# Patient Record
Sex: Male | Born: 2000 | Race: Black or African American | Hispanic: No | Marital: Single | Smoking: Current some day smoker
Health system: Southern US, Community
[De-identification: ages and names within clinical notes are randomized; demographics above are authoritative.]

## PROBLEM LIST (undated history)

## (undated) DIAGNOSIS — F329 Major depressive disorder, single episode, unspecified: Secondary | ICD-10-CM

## (undated) DIAGNOSIS — F32A Depression, unspecified: Secondary | ICD-10-CM

## (undated) DIAGNOSIS — J45909 Unspecified asthma, uncomplicated: Secondary | ICD-10-CM

## (undated) DIAGNOSIS — F319 Bipolar disorder, unspecified: Secondary | ICD-10-CM

---
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
## 2018-03-10 ENCOUNTER — Other Ambulatory Visit: Payer: Self-pay

## 2018-03-10 ENCOUNTER — Observation Stay
Admission: EM | Admit: 2018-03-10 | Discharge: 2018-03-12 | Disposition: A | Discharge status: Home / Self Care | Payer: Medicaid Other | Attending: Internal Medicine | Admitting: Internal Medicine

## 2018-03-10 ENCOUNTER — Encounter: Payer: Self-pay | Admitting: Emergency Medicine

## 2018-03-10 ENCOUNTER — Emergency Department: Payer: Medicaid Other

## 2018-03-10 DIAGNOSIS — J181 Lobar pneumonia, unspecified organism: Secondary | ICD-10-CM | POA: Diagnosis present

## 2018-03-10 DIAGNOSIS — Z825 Family history of asthma and other chronic lower respiratory diseases: Secondary | ICD-10-CM | POA: Diagnosis not present

## 2018-03-10 DIAGNOSIS — F17200 Nicotine dependence, unspecified, uncomplicated: Secondary | ICD-10-CM | POA: Insufficient documentation

## 2018-03-10 DIAGNOSIS — J101 Influenza due to other identified influenza virus with other respiratory manifestations: Principal | ICD-10-CM | POA: Insufficient documentation

## 2018-03-10 DIAGNOSIS — Z716 Tobacco abuse counseling: Secondary | ICD-10-CM | POA: Insufficient documentation

## 2018-03-10 DIAGNOSIS — J4520 Mild intermittent asthma, uncomplicated: Secondary | ICD-10-CM | POA: Insufficient documentation

## 2018-03-10 DIAGNOSIS — J189 Pneumonia, unspecified organism: Secondary | ICD-10-CM | POA: Diagnosis not present

## 2018-03-10 DIAGNOSIS — E876 Hypokalemia: Secondary | ICD-10-CM | POA: Diagnosis not present

## 2018-03-10 DIAGNOSIS — Z79899 Other long term (current) drug therapy: Secondary | ICD-10-CM | POA: Diagnosis not present

## 2018-03-10 HISTORY — DX: Unspecified asthma, uncomplicated: J45.909

## 2018-03-10 LAB — CBC WITH DIFFERENTIAL/PLATELET
Abs Immature Granulocytes: 0.07 10*3/uL (ref 0.00–0.07)
BASOS ABS: 0 10*3/uL (ref 0.0–0.1)
BASOS PCT: 0 %
Eosinophils Absolute: 0 10*3/uL (ref 0.0–0.5)
Eosinophils Relative: 0 %
HCT: 41.8 % (ref 39.0–52.0)
Hemoglobin: 14 g/dL (ref 13.0–17.0)
Immature Granulocytes: 1 %
Lymphocytes Relative: 6 %
Lymphs Abs: 0.7 10*3/uL (ref 0.7–4.0)
MCH: 27.9 pg (ref 26.0–34.0)
MCHC: 33.5 g/dL (ref 30.0–36.0)
MCV: 83.4 fL (ref 80.0–100.0)
Monocytes Absolute: 0.2 10*3/uL (ref 0.1–1.0)
Monocytes Relative: 2 %
NEUTROS ABS: 10.2 10*3/uL — AB (ref 1.7–7.7)
NEUTROS PCT: 91 %
NRBC: 0 % (ref 0.0–0.2)
PLATELETS: 131 10*3/uL — AB (ref 150–400)
RBC: 5.01 MIL/uL (ref 4.22–5.81)
RDW: 12.6 % (ref 11.5–15.5)
Smear Review: NORMAL
WBC MORPHOLOGY: INCREASED
WBC: 11.2 10*3/uL — ABNORMAL HIGH (ref 4.0–10.5)

## 2018-03-10 LAB — INFLUENZA PANEL BY PCR (TYPE A & B)
Influenza A By PCR: NEGATIVE
Influenza B By PCR: POSITIVE — AB

## 2018-03-10 LAB — COMPREHENSIVE METABOLIC PANEL
ALT: 14 U/L (ref 0–44)
ANION GAP: 7 (ref 5–15)
AST: 25 U/L (ref 15–41)
Albumin: 3.8 g/dL (ref 3.5–5.0)
Alkaline Phosphatase: 56 U/L (ref 38–126)
BUN: 17 mg/dL (ref 6–20)
CHLORIDE: 107 mmol/L (ref 98–111)
CO2: 25 mmol/L (ref 22–32)
Calcium: 8.2 mg/dL — ABNORMAL LOW (ref 8.9–10.3)
Creatinine, Ser: 1.16 mg/dL (ref 0.61–1.24)
GFR calc non Af Amer: >60 mL/min (ref >60)
Glucose, Bld: 133 mg/dL — ABNORMAL HIGH (ref 70–99)
POTASSIUM: 3.3 mmol/L — AB (ref 3.5–5.1)
SODIUM: 139 mmol/L (ref 135–145)
Total Bilirubin: 0.6 mg/dL (ref 0.3–1.2)
Total Protein: 7.1 g/dL (ref 6.5–8.1)

## 2018-03-10 LAB — STREP PNEUMONIAE URINARY ANTIGEN: Strep Pneumo Urinary Antigen: NEGATIVE

## 2018-03-10 LAB — MAGNESIUM: Magnesium: 1.8 mg/dL (ref 1.7–2.4)

## 2018-03-10 LAB — LACTIC ACID, PLASMA: LACTIC ACID, VENOUS: 1.6 mmol/L (ref 0.5–1.9)

## 2018-03-10 MED ORDER — SODIUM CHLORIDE 0.9 % IV SOLN
500.0000 mg | INTRAVENOUS | Status: DC
  Administered 2018-03-11: 500 mg via INTRAVENOUS
  Filled 2018-03-10 (×2): qty 500

## 2018-03-10 MED ORDER — SODIUM CHLORIDE 0.9 % IV SOLN
500.0000 mg | Freq: Once | INTRAVENOUS | Status: AC
  Administered 2018-03-10: 500 mg via INTRAVENOUS
  Filled 2018-03-10: qty 500

## 2018-03-10 MED ORDER — KETOROLAC TROMETHAMINE 30 MG/ML IJ SOLN
30.0000 mg | Freq: Once | INTRAMUSCULAR | Status: AC
  Administered 2018-03-10: 30 mg via INTRAVENOUS
  Filled 2018-03-10: qty 1

## 2018-03-10 MED ORDER — POTASSIUM CHLORIDE CRYS ER 20 MEQ PO TBCR
40.0000 meq | EXTENDED_RELEASE_TABLET | Freq: Once | ORAL | Status: AC
  Administered 2018-03-10: 40 meq via ORAL
  Filled 2018-03-10: qty 2

## 2018-03-10 MED ORDER — OSELTAMIVIR PHOSPHATE 75 MG PO CAPS
75.0000 mg | ORAL_CAPSULE | Freq: Two times a day (BID) | ORAL | Status: DC
  Administered 2018-03-10 – 2018-03-12 (×5): 75 mg via ORAL
  Filled 2018-03-10 (×5): qty 1

## 2018-03-10 MED ORDER — ONDANSETRON HCL 4 MG/2ML IJ SOLN
INTRAMUSCULAR | Status: AC
  Administered 2018-03-10: 4 mg via INTRAVENOUS
  Filled 2018-03-10: qty 2

## 2018-03-10 MED ORDER — ACETAMINOPHEN 325 MG PO TABS
650.0000 mg | ORAL_TABLET | Freq: Four times a day (QID) | ORAL | Status: DC | PRN
  Administered 2018-03-10: 650 mg via ORAL
  Filled 2018-03-10: qty 2

## 2018-03-10 MED ORDER — GUAIFENESIN 100 MG/5ML PO SOLN
5.0000 mL | ORAL | Status: DC | PRN
  Administered 2018-03-10 – 2018-03-11 (×3): 100 mg via ORAL
  Filled 2018-03-10 (×7): qty 5

## 2018-03-10 MED ORDER — SODIUM CHLORIDE 0.9 % IV SOLN
1.0000 g | INTRAVENOUS | Status: DC
  Administered 2018-03-11: 1 g via INTRAVENOUS
  Filled 2018-03-10: qty 1
  Filled 2018-03-10: qty 10

## 2018-03-10 MED ORDER — ALBUTEROL SULFATE (2.5 MG/3ML) 0.083% IN NEBU: 2.5000 mg | INHALATION_SOLUTION | RESPIRATORY_TRACT | Status: DC | PRN

## 2018-03-10 MED ORDER — IPRATROPIUM-ALBUTEROL 0.5-2.5 (3) MG/3ML IN SOLN
3.0000 mL | Freq: Once | RESPIRATORY_TRACT | Status: AC
  Administered 2018-03-10: 3 mL via RESPIRATORY_TRACT
  Filled 2018-03-10: qty 3

## 2018-03-10 MED ORDER — BISACODYL 5 MG PO TBEC: 5.0000 mg | DELAYED_RELEASE_TABLET | Freq: Every day | ORAL | Status: DC | PRN

## 2018-03-10 MED ORDER — HYDROCODONE-ACETAMINOPHEN 5-325 MG PO TABS
1.0000 | ORAL_TABLET | ORAL | Status: DC | PRN
  Administered 2018-03-10 – 2018-03-12 (×5): 2 via ORAL
  Filled 2018-03-10 (×5): qty 2

## 2018-03-10 MED ORDER — SODIUM CHLORIDE 0.9 % IV SOLN
1.0000 g | Freq: Once | INTRAVENOUS | Status: AC
  Administered 2018-03-10: 1 g via INTRAVENOUS
  Filled 2018-03-10: qty 10

## 2018-03-10 MED ORDER — SODIUM CHLORIDE 0.9 % IV SOLN
1000.0000 mL | Freq: Once | INTRAVENOUS | Status: AC
  Administered 2018-03-10: 1000 mL via INTRAVENOUS

## 2018-03-10 MED ORDER — ONDANSETRON HCL 4 MG/2ML IJ SOLN
4.0000 mg | Freq: Once | INTRAMUSCULAR | Status: AC
  Administered 2018-03-10: 4 mg via INTRAVENOUS

## 2018-03-10 MED ORDER — ENOXAPARIN SODIUM 40 MG/0.4ML ~~LOC~~ SOLN
40.0000 mg | SUBCUTANEOUS | Status: DC
  Filled 2018-03-10: qty 0.4

## 2018-03-10 MED ORDER — ONDANSETRON HCL 4 MG/2ML IJ SOLN
4.0000 mg | Freq: Four times a day (QID) | INTRAMUSCULAR | Status: DC | PRN
Start: 2018-03-10 — End: 2018-03-12

## 2018-03-10 MED ORDER — MORPHINE SULFATE (PF) 4 MG/ML IV SOLN
INTRAVENOUS | Status: AC
  Administered 2018-03-10: 4 mg via INTRAVENOUS
  Filled 2018-03-10: qty 1

## 2018-03-10 MED ORDER — SENNOSIDES-DOCUSATE SODIUM 8.6-50 MG PO TABS: 1.0000 | ORAL_TABLET | Freq: Every evening | ORAL | Status: DC | PRN

## 2018-03-10 MED ORDER — SODIUM CHLORIDE 0.9 % IV SOLN
INTRAVENOUS | Status: DC
  Administered 2018-03-10 – 2018-03-11 (×3): via INTRAVENOUS

## 2018-03-10 MED ORDER — ONDANSETRON HCL 4 MG PO TABS: 4.0000 mg | ORAL_TABLET | Freq: Four times a day (QID) | ORAL | Status: DC | PRN

## 2018-03-10 MED ORDER — MORPHINE SULFATE (PF) 4 MG/ML IV SOLN
4.0000 mg | Freq: Once | INTRAVENOUS | Status: AC
  Administered 2018-03-10: 4 mg via INTRAVENOUS

## 2018-03-10 MED ORDER — ACETAMINOPHEN 650 MG RE SUPP: 650.0000 mg | Freq: Four times a day (QID) | RECTAL | Status: DC | PRN

## 2018-03-10 NOTE — ED Provider Notes (Signed)
[facility] Emergency Department Provider Note   ____________________________________________    I have reviewed the triage vital signs and the nursing notes.   HISTORY  Chief Complaint flu like symptoms     HPI Hunter Thompson is a 18 y.o. male who reports he was diagnosed with flu 2 days ago with right-sided chest pain, episode of hemoptysis and worsening cough with continued fever.  Has not taken medications prescribed to him by [place].  Reports aching right-sided upper chest pain, severe coughing which kept him up all night.  As well as shortness of breath.  No recent travel.  Has not take anything for this.  Prescriptions were given but unable to get them filled until today, he does not know what they were.  Past Medical History:  Diagnosis Date  . Asthma     Patient Active Problem List   Diagnosis Date Noted  . Pneumonia 03/10/2018    History reviewed. No pertinent surgical history.  Prior to Admission medications   Medication Sig Start Date End Date Taking? Authorizing Provider  ibuprofen (ADVIL,MOTRIN) 600 MG tablet Take 600 mg by mouth every 6 (six) hours. 01/17/18  Yes [provider]     Allergies Patient has no known allergies.  Family History  Problem Relation Age of Onset  . Diabetes Father   . Asthma Mother     Social History Social History   Tobacco Use  . Smoking status: Current Some Day Smoker  . Smokeless tobacco: Never Used  Substance Use Topics  . Alcohol use: No  . Drug use: No    Review of Systems  Constitutional: Positive fever/chills Eyes: No visual changes.  ENT: No sore throat. Cardiovascular: As above Respiratory: As above Gastrointestinal: No nausea, no vomiting.   Genitourinary: Negative for dysuria. Musculoskeletal: As above Skin: Negative for rash. Neurological: Negative for headaches    ____________________________________________   PHYSICAL EXAM:  VITAL SIGNS: ED  Triage Vitals  Enc Vitals Group     BP 03/10/18 0748 (!) 90/53     Pulse Rate 03/10/18 0748 (!) 115     Resp 03/10/18 0748 20     Temp 03/10/18 0748 100.1 F (37.8 C)     Temp Source 03/10/18 0748 Oral     SpO2 03/10/18 0748 97 %     Weight 03/10/18 0749 90.7 kg (200 lb)     Height 03/10/18 0749 1.88 m (6\' 2" )     Head Circumference --      Peak Flow --      Pain Score 03/10/18 0749 10     Pain Loc --      Pain Edu? --      Excl. in GC? --     Constitutional: Alert and oriented.  Eyes: Conjunctivae are normal.   Nose: No congestion/rhinnorhea. Mouth/Throat: Mucous membranes are moist.    Cardiovascular: Tachycardia regular rhythm. Grossly normal heart sounds.  Good peripheral circulation. Respiratory: Normal respiratory effort.  No retractions. Lungs CTAB. Gastrointestinal: Soft and nontender. No distention.    Musculoskeletal: No lower extremity tenderness nor edema.  Warm and well perfused Neurologic:  Normal speech and language. No gross focal neurologic deficits are appreciated.  Skin:  Skin is warm, dry and intact. No rash noted. Psychiatric: Mood and affect are normal. Speech and behavior are normal.  ____________________________________________   LABS (all labs ordered are listed, but only abnormal results are displayed)  Labs Reviewed  CBC WITH DIFFERENTIAL/PLATELET - Abnormal; Notable for the  following components:      Result Value   WBC 11.2 (*)    Platelets 131 (*)    Neutro Abs 10.2 (*)    All other components within normal limits  COMPREHENSIVE METABOLIC PANEL - Abnormal; Notable for the following components:   Potassium 3.3 (*)    Glucose, Bld 133 (*)    Calcium 8.2 (*)    All other components within normal limits  CULTURE, BLOOD (ROUTINE X 2)  CULTURE, BLOOD (ROUTINE X 2)  LACTIC ACID, PLASMA   ____________________________________________  EKG  ED ECG REPORT I, Jene Every, the attending physician, personally viewed and interpreted this  ECG.  Date: 03/10/2018  Rhythm: normal sinus rhythm QRS Axis: normal Intervals: normal ST/T Wave abnormalities: normal Narrative Interpretation: no evidence of acute ischemia  ____________________________________________  RADIOLOGY  Right upper lobe infiltrate ____________________________________________   PROCEDURES  Procedure(s) performed: No  Procedures   Critical Care performed: No ____________________________________________   INITIAL IMPRESSION / ASSESSMENT AND PLAN / ED COURSE  Pertinent labs & imaging results that were available during my care of the patient were reviewed by me and considered in my medical decision making (see chart for details).  Patient presents with reported positive flu diagnosis 2 days ago, now with worsening shortness of breath, severe cough, hemoptysis.  Initial presentation concerning for mild hypotension, elevated temperature and tachycardia.  Pending labs will send lactic and blood cultures.  Chest x-ray demonstrates pneumonia.  At this time will cover with Rocephin and azithromycin, will consider staph coverage based on lab work  Patient having some right-sided chest pain likely from pneumonia, treated with Toradol    ____________________________________________   FINAL CLINICAL IMPRESSION(S) / ED DIAGNOSES  Final diagnoses:  Pneumonia of right upper lobe due to infectious organism [facility])        Note:  This document was prepared using Dragon voice recognition software and may include unintentional dictation errors.   Jene Every, MD 03/10/18 1145

## 2018-03-10 NOTE — H&P (Signed)
[facility] - [place] [facility]   PATIENT NAME: Hunter Thompson    MR#:  [id number]  DATE OF BIRTH:  [birth year]  DATE OF ADMISSION:  03/10/2018  PRIMARY CARE PHYSICIAN: System, Pcp Not In   REQUESTING/REFERRING PHYSICIAN: Dr. Cyril Loosen.  CHIEF COMPLAINT:   Chief Complaint  Patient presents with  . flu like symptoms   Fever, chills, muscle aching for 8 days, cough, shortness of breath with chest pain for 2 days. HISTORY OF PRESENT ILLNESS:  Hunter Thompson  is a 18 y.o. male with a known history of asthma.  The patient presents the ED with above chief complaints.  He has had fever, chills, muscle aching and cough since last week.  He went to the hospital and diagnosed with influenza B, for which he was prescribed medication.  But he has not filled it over the weekend.  His symptoms has been worsening, in addition, he started to have shortness of breath, cough with dark sputum and shortness of breath.  He also complains of right-sided chest pain while coughing and taking deep breaths.  Chest x-ray showed right-sided pneumonia.  He is treated with Zithromax and Rocephin in the ED.  PAST MEDICAL HISTORY:   Past Medical History:  Diagnosis Date  . Asthma     PAST SURGICAL HISTORY:  History reviewed. No pertinent surgical history.  Mouth surgery.  SOCIAL HISTORY:   Social History   Tobacco Use  . Smoking status: Current Some Day Smoker  . Smokeless tobacco: Never Used  Substance Use Topics  . Alcohol use: No    FAMILY HISTORY:   Family History  Problem Relation Age of Onset  . Diabetes Father   . Asthma Mother     DRUG ALLERGIES:  No Known Allergies  REVIEW OF SYSTEMS:   Review of Systems  Constitutional: Positive for chills, fever and malaise/fatigue.  HENT: Negative for sore throat.   Eyes: Negative for blurred vision and double vision.  Respiratory: Positive for cough, sputum production, shortness of breath and wheezing. Negative for hemoptysis  and stridor.   Cardiovascular: Negative for chest pain, palpitations, orthopnea and leg swelling.  Gastrointestinal: Positive for abdominal pain and nausea. Negative for blood in stool, diarrhea, melena and vomiting.  Genitourinary: Negative for dysuria, flank pain and hematuria.  Musculoskeletal: Positive for myalgias. Negative for back pain and joint pain.  Skin: Negative for rash.  Neurological: Negative for dizziness, sensory change, focal weakness, seizures, loss of consciousness, weakness and headaches.  Endo/Heme/Allergies: Negative for polydipsia.  Psychiatric/Behavioral: Negative for depression. The patient is not nervous/anxious.     MEDICATIONS AT HOME:   Prior to Admission medications   Not on File      VITAL SIGNS:  Blood pressure 116/66, pulse 100, temperature 100.1 F (37.8 C), temperature source Oral, resp. rate (!) 23, height 6\' 2"  (1.88 m), weight 90.7 kg, SpO2 97 %.  PHYSICAL EXAMINATION:  Physical Exam  GENERAL:  18 y.o.-year-old patient lying in the bed with no acute distress.  EYES: Pupils equal, round, reactive to light and accommodation. No scleral icterus. Extraocular muscles intact.  HEENT: Head atraumatic, normocephalic. Oropharynx and nasopharynx clear.  NECK:  Supple, no jugular venous distention. No thyroid enlargement, no tenderness.  LUNGS: Diminished breath sounds on the right side, bilateral expiratory wheezing and rhonchi. No use of accessory muscles of respiration.  CARDIOVASCULAR: S1, S2 normal. No murmurs, rubs, or gallops.  ABDOMEN: Soft, nontender, nondistended. Bowel sounds present. No organomegaly or mass.  EXTREMITIES: No  pedal edema, cyanosis, or clubbing.  NEUROLOGIC: Cranial nerves II through XII are intact. Muscle strength 5/5 in all extremities. Sensation intact. Gait not checked.  PSYCHIATRIC: The patient is alert and oriented x 3.  SKIN: No obvious rash, lesion, or ulcer.   LABORATORY PANEL:   CBC Recent Labs  Lab  03/10/18 0822  WBC 11.2*  HGB 14.0  HCT 41.8  PLT 131*   ------------------------------------------------------------------------------------------------------------------  Chemistries  Recent Labs  Lab 03/10/18 0822  NA 139  K 3.3*  CL 107  CO2 25  GLUCOSE 133*  BUN 17  CREATININE 1.16  CALCIUM 8.2*  AST 25  ALT 14  ALKPHOS 56  BILITOT 0.6   ------------------------------------------------------------------------------------------------------------------  Cardiac Enzymes No results for input(s): TROPONINI in the last 168 hours. ------------------------------------------------------------------------------------------------------------------  RADIOLOGY:  Dg Chest 2 View  Result Date: 03/10/2018 CLINICAL DATA:  Shortness of breath.  Chest pain EXAM: CHEST - 2 VIEW COMPARISON:  03/08/2018 FINDINGS: Focal right upper lobe airspace disease. No cavitation or effusion. Normal heart size and mediastinal contours. Nodular artifact from EKG leads. IMPRESSION: Right upper lobe pneumonia. Electronically Signed   By: Marnee SpringJonathon  Watts M.D.   On: 03/10/2018 08:19      IMPRESSION AND PLAN:   Pneumonia, CAP. The patient will be admitted to medical floor. Continue Zithromax, Rocephin, Robitussin as needed.  Follow-up CBC and cultures.  Influenza B.  Start Tamiflu.  Hypokalemia.  Potassium supplement.  Asthma.  DuoNeb as needed.  Tobacco abuse.  Smoking cessation is counseled for 3 to 4 minutes.  All the records are reviewed and case discussed with ED provider. Management plans discussed with the patient, family and they are in agreement.  CODE STATUS: Full code  TOTAL TIME TAKING CARE OF THIS PATIENT: 35 minutes.    Shaune PollackQing Ismerai Bin M.D on 03/10/2018 at 11:03 AM  Between 7am to 6pm - Pager - [phone]  After 6pm go to [email or web address] - Social research officer, [email or web address] [email or web address]  [facility] [place] [facility]  Office  [phone]  CC: Primary care physician; System, Pcp Not  In   Note: This dictation was prepared with Dragon dictation along with smaller phrase technology. Any transcriptional errors that result from this process are unin
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
## 2018-03-11 LAB — BASIC METABOLIC PANEL
Anion gap: 3 — ABNORMAL LOW (ref 5–15)
BUN: 11 mg/dL (ref 6–20)
CO2: 26 mmol/L (ref 22–32)
Calcium: 8.1 mg/dL — ABNORMAL LOW (ref 8.9–10.3)
Chloride: 110 mmol/L (ref 98–111)
Creatinine, Ser: 1.06 mg/dL (ref 0.61–1.24)
GFR calc Af Amer: >60 mL/min (ref >60)
GFR calc non Af Amer: >60 mL/min (ref >60)
Glucose, Bld: 114 mg/dL — ABNORMAL HIGH (ref 70–99)
Potassium: 3.9 mmol/L (ref 3.5–5.1)
Sodium: 139 mmol/L (ref 135–145)

## 2018-03-11 LAB — CBC
HCT: 41.2 % (ref 39.0–52.0)
Hemoglobin: 13.4 g/dL (ref 13.0–17.0)
MCH: 28.3 pg (ref 26.0–34.0)
MCHC: 32.5 g/dL (ref 30.0–36.0)
MCV: 86.9 fL (ref 80.0–100.0)
Platelets: 127 10*3/uL — ABNORMAL LOW (ref 150–400)
RBC: 4.74 MIL/uL (ref 4.22–5.81)
RDW: 12.9 % (ref 11.5–15.5)
WBC: 11.8 10*3/uL — ABNORMAL HIGH (ref 4.0–10.5)
nRBC: 0 % (ref 0.0–0.2)

## 2018-03-11 LAB — HIV ANTIBODY (ROUTINE TESTING W REFLEX): HIV Screen 4th Generation wRfx: NONREACTIVE

## 2018-03-11 MED ORDER — TRAMADOL HCL 50 MG PO TABS: 50.0000 mg | ORAL_TABLET | Freq: Four times a day (QID) | ORAL | 0 refills | Status: AC | PRN

## 2018-03-11 MED ORDER — TRAMADOL HCL 50 MG PO TABS
50.0000 mg | ORAL_TABLET | Freq: Four times a day (QID) | ORAL | Status: DC | PRN
  Administered 2018-03-12: 50 mg via ORAL
  Filled 2018-03-11: qty 1

## 2018-03-11 MED ORDER — LEVOFLOXACIN 750 MG PO TABS: 750.0000 mg | ORAL_TABLET | Freq: Every day | ORAL | 0 refills | Status: DC

## 2018-03-11 MED ORDER — OSELTAMIVIR PHOSPHATE 75 MG PO CAPS: 75.0000 mg | ORAL_CAPSULE | Freq: Two times a day (BID) | ORAL | 0 refills | Status: AC

## 2018-03-11 NOTE — Discharge Summary (Signed)
[facility] [place] at [facility]   PATIENT NAME: Hunter Thompson    MR#:  [id number]  DATE OF BIRTH:  [birth year]  DATE OF ADMISSION:  03/10/2018 ADMITTING PHYSICIAN: Shaune Pollack, MD  DATE OF DISCHARGE: 03/11/2018  PRIMARY CARE PHYSICIAN: System, Pcp Not In    ADMISSION DIAGNOSIS:  Pneumonia of right upper lobe due to infectious organism (HCC) [J18.1]  DISCHARGE DIAGNOSIS:  Active Problems:   Pneumonia   SECONDARY DIAGNOSIS:   Past Medical History:  Diagnosis Date  . Asthma     HOSPITAL COURSE:   18 year old male with history of mild intermittent asthma who presented with shortness of breath and flulike symptoms.  1.  Community-acquired pneumonia (right upper quadrant): Patient will be discharged on oral Levaquin for total 5 days.  Blood cultures have been negative.  2.  Influenza B: Patient will continue Tamiflu for total 5 days.  3.  Mild intermittent asthma without signs of exacerbation  4.Tobacco dependence: Patient is encouraged to quit smoking. Counseling was provided for 4 minutes.    DISCHARGE CONDITIONS AND DIET:   Stable Regular diet  CONSULTS OBTAINED:    DRUG ALLERGIES:  No Known Allergies  DISCHARGE MEDICATIONS:   Allergies as of 03/11/2018   No Known Allergies     Medication List    TAKE these medications   ibuprofen 600 MG tablet Commonly known as:  ADVIL,MOTRIN Take 600 mg by mouth every 6 (six) hours.   levofloxacin 750 MG tablet Commonly known as:  LEVAQUIN Take 1 tablet (750 mg total) by mouth daily.   oseltamivir 75 MG capsule Commonly known as:  TAMIFLU Take 1 capsule (75 mg total) by mouth 2 (two) times daily for 4 days.   traMADol 50 MG tablet Commonly known as:  ULTRAM Take 1 tablet (50 mg total) by mouth every 6 (six) hours as needed for up to 3 days for moderate pain.         Today   CHIEF COMPLAINT:   Patient with improved symptoms however still with pain on the right side from  pneumonia   VITAL SIGNS:  Blood pressure 123/65, pulse 78, temperature 98.3 F (36.8 C), temperature source Oral, resp. rate 20, height 6\' 2"  (1.88 m), weight 90.7 kg, SpO2 100 %.   REVIEW OF SYSTEMS:  Review of Systems  Constitutional: Negative.  Negative for chills, fever and malaise/fatigue.  HENT: Negative.  Negative for ear discharge, ear pain, hearing loss, nosebleeds and sore throat.   Eyes: Negative.  Negative for blurred vision and pain.  Respiratory: Positive for cough. Negative for hemoptysis, shortness of breath and wheezing.        Pain right side where pneumonia is.  Cardiovascular: Negative.  Negative for chest pain, palpitations and leg swelling.  Gastrointestinal: Negative.  Negative for abdominal pain, blood in stool, diarrhea, nausea and vomiting.  Genitourinary: Negative.  Negative for dysuria.  Musculoskeletal: Negative.  Negative for back pain.  Skin: Negative.   Neurological: Negative for dizziness, tremors, speech change, focal weakness, seizures and headaches.  Endo/Heme/Allergies: Negative.  Does not bruise/bleed easily.  Psychiatric/Behavioral: Negative.  Negative for depression, hallucinations and suicidal ideas.     PHYSICAL EXAMINATION:  GENERAL:  18 y.o.-year-old patient lying in the bed with no acute distress.  NECK:  Supple, no jugular venous distention. No thyroid enlargement, no tenderness.  LUNGS: Crackles right upper lung CARDIOVASCULAR: S1, S2 normal. No murmurs, rubs, or gallops.  ABDOMEN: Soft, non-tender, non-distended. Bowel sounds present. No organomegaly or mass.  EXTREMITIES: No pedal edema, cyanosis, or clubbing.  PSYCHIATRIC: The patient is alert and oriented x 3.  SKIN: No obvious rash, lesion, or ulcer.   DATA REVIEW:   CBC Recent Labs  Lab 03/11/18 0545  WBC 11.8*  HGB 13.4  HCT 41.2  PLT 127*    Chemistries  Recent Labs  Lab 03/10/18 0822 03/10/18 1452 03/11/18 0545  NA 139  --  139  K 3.3*  --  3.9  CL 107   --  110  CO2 25  --  26  GLUCOSE 133*  --  114*  BUN 17  --  11  CREATININE 1.16  --  1.06  CALCIUM 8.2*  --  8.1*  MG  --  1.8  --   AST 25  --   --   ALT 14  --   --   ALKPHOS 56  --   --   BILITOT 0.6  --   --     Cardiac Enzymes No results for input(s): TROPONINI in the last 168 hours.  Microbiology Results  @MICRORSLT48 @  RADIOLOGY:  Dg Chest 2 View  Result Date: 03/10/2018 CLINICAL DATA:  Shortness of breath.  Chest pain EXAM: CHEST - 2 VIEW COMPARISON:  03/08/2018 FINDINGS: Focal right upper lobe airspace disease. No cavitation or effusion. Normal heart size and mediastinal contours. Nodular artifact from EKG leads. IMPRESSION: Right upper lobe pneumonia. Electronically Signed   By: Marnee Spring M.D.   On: 03/10/2018 08:19      Allergies as of 03/11/2018   No Known Allergies     Medication List    TAKE these medications   ibuprofen 600 MG tablet Commonly known as:  ADVIL,MOTRIN Take 600 mg by mouth every 6 (six) hours.   levofloxacin 750 MG tablet Commonly known as:  LEVAQUIN Take 1 tablet (750 mg total) by mouth daily.   oseltamivir 75 MG capsule Commonly known as:  TAMIFLU Take 1 capsule (75 mg total) by mouth 2 (two) times daily for 4 days.   traMADol 50 MG tablet Commonly known as:  ULTRAM Take 1 tablet (50 mg total) by mouth every 6 (six) hours as needed for up to 3 days for moderate pain.           Management plans discussed with the patient and he is in agreement. Stable for discharge home  Patient should follow up with pcp  CODE STATUS:     Code Status Orders  (From admission, onward)         Start     Ordered   03/10/18 1348  Full code  Continuous     03/10/18 1348        Code Status History    This patient has a current code status but no historical code status.      TOTAL TIME TAKING CARE OF THIS PATIENT: 38 minutes.    Note: This dictation was prepared with Dragon dictation along with smaller phrase technology.  Any transcriptional errors that result from this process are unintentional.  Maximilliano Kersh M.D on 03/11/2018 at 12:14 PM  Between 7am to 6pm - Pager - [phone] After 6pm go to [email or web address] - password [email or web address]  [facility]  Office  [phone]  CC: Primary care physician; System, Pcp Not In

## 2018-03-11 NOTE — Progress Notes (Signed)
   03/11/18 1400  Clinical Encounter Type  Visited With Patient and family together  Visit Type Initial;Spiritual support  Referral From Chaplain  Consult/Referral To Chaplain  Spiritual Encounters  Spiritual Needs Prayer;Emotional  Chaplain was rounding and stop to visit patient. Parents were at patient bedside. Patient was in pain groaning and moaning. Chaplain introduced herself and told patient and parents she was just checking on patient. Parents thanked Orthoptist. Chaplain told parents that the nurse was outside the doctor with medicine. Parents said they had just told patient that they heard the nurse. Chaplain ask if she could prayer for patient, parents said yes, sure. Chaplain said a prayer of healing, comfort and peace. Chaplain finished and parents and patient said thank you.

## 2018-03-11 NOTE — Progress Notes (Signed)
Pt decided to stay another night. Ok pr dr.mody. discharge d/c until am

## 2018-03-12 NOTE — Progress Notes (Signed)
Hunter Thompson  A and O x 4. VSS. Pt tolerating diet well. No complaints of pain or nausea. IV removed intact, prescriptions given. Pt voiced understanding of discharge instructions with no further questions. Pt discharged home.   Allergies as of 03/12/2018   No Known Allergies     Medication List    TAKE these medications   ibuprofen 600 MG tablet Commonly known as:  ADVIL,MOTRIN Take 600 mg by mouth every 6 (six) hours.   levofloxacin 750 MG tablet Commonly known as:  LEVAQUIN Take 1 tablet (750 mg total) by mouth daily.   oseltamivir 75 MG capsule Commonly known as:  TAMIFLU Take 1 capsule (75 mg total) by mouth 2 (two) times daily for 4 days.   traMADol 50 MG tablet Commonly known as:  ULTRAM Take 1 tablet (50 mg total) by mouth every 6 (six) hours as needed for up to 3 days for moderate pain.       Vitals:   03/11/18 1953 03/12/18 0431  BP: 114/62 (!) 102/53  Pulse: 80 92  Resp: 16 20  Temp: 98.7 F (37.1 C) 99.6 F (37.6 C)  SpO2: 99% 100%    Suzzanne Cloud

## 2018-03-15 LAB — CULTURE, BLOOD (ROUTINE X 2)
CULTURE: NO GROWTH
Culture: NO GROWTH

## 2018-10-05 ENCOUNTER — Emergency Department (HOSPITAL_COMMUNITY): Payer: Medicaid Other

## 2018-10-05 ENCOUNTER — Encounter (HOSPITAL_COMMUNITY): Payer: Self-pay

## 2018-10-05 ENCOUNTER — Emergency Department (HOSPITAL_COMMUNITY)
Admission: EM | Admit: 2018-10-05 | Discharge: 2018-10-05 | Disposition: A | Discharge status: Other Acute Inpatient Hospital | Payer: Medicaid Other | Attending: Emergency Medicine | Admitting: Emergency Medicine

## 2018-10-05 ENCOUNTER — Other Ambulatory Visit: Payer: Self-pay

## 2018-10-05 DIAGNOSIS — R45851 Suicidal ideations: Secondary | ICD-10-CM | POA: Diagnosis not present

## 2018-10-05 DIAGNOSIS — F3130 Bipolar disorder, current episode depressed, mild or moderate severity, unspecified: Secondary | ICD-10-CM | POA: Diagnosis present

## 2018-10-05 DIAGNOSIS — F17210 Nicotine dependence, cigarettes, uncomplicated: Secondary | ICD-10-CM | POA: Insufficient documentation

## 2018-10-05 DIAGNOSIS — J45909 Unspecified asthma, uncomplicated: Secondary | ICD-10-CM | POA: Diagnosis not present

## 2018-10-05 DIAGNOSIS — Z20828 Contact with and (suspected) exposure to other viral communicable diseases: Secondary | ICD-10-CM | POA: Diagnosis not present

## 2018-10-05 HISTORY — DX: Depression, unspecified: F32.A

## 2018-10-05 HISTORY — DX: Bipolar disorder, unspecified: F31.9

## 2018-10-05 LAB — COMPREHENSIVE METABOLIC PANEL
ALT: 17 U/L (ref 0–44)
AST: 26 U/L (ref 15–41)
Albumin: 4.5 g/dL (ref 3.5–5.0)
Alkaline Phosphatase: 67 U/L (ref 38–126)
Anion gap: 8 (ref 5–15)
BUN: 12 mg/dL (ref 6–20)
CO2: 26 mmol/L (ref 22–32)
Calcium: 9.3 mg/dL (ref 8.9–10.3)
Chloride: 104 mmol/L (ref 98–111)
Creatinine, Ser: 1.28 mg/dL — ABNORMAL HIGH (ref 0.61–1.24)
GFR calc Af Amer: >60 mL/min (ref >60)
GFR calc non Af Amer: >60 mL/min (ref >60)
Glucose, Bld: 92 mg/dL (ref 70–99)
Potassium: 3.7 mmol/L (ref 3.5–5.1)
Sodium: 138 mmol/L (ref 135–145)
Total Bilirubin: 0.8 mg/dL (ref 0.3–1.2)
Total Protein: 7.4 g/dL (ref 6.5–8.1)

## 2018-10-05 LAB — CBC
HCT: 47.1 % (ref 39.0–52.0)
Hemoglobin: 15.3 g/dL (ref 13.0–17.0)
MCH: 28.7 pg (ref 26.0–34.0)
MCHC: 32.5 g/dL (ref 30.0–36.0)
MCV: 88.2 fL (ref 80.0–100.0)
Platelets: 179 10*3/uL (ref 150–400)
RBC: 5.34 MIL/uL (ref 4.22–5.81)
RDW: 12.7 % (ref 11.5–15.5)
WBC: 7.3 10*3/uL (ref 4.0–10.5)
nRBC: 0 % (ref 0.0–0.2)

## 2018-10-05 LAB — RAPID URINE DRUG SCREEN, HOSP PERFORMED
Amphetamines: NOT DETECTED
Barbiturates: NOT DETECTED
Benzodiazepines: NOT DETECTED
Cocaine: NOT DETECTED
Opiates: NOT DETECTED
Tetrahydrocannabinol: POSITIVE — AB

## 2018-10-05 LAB — SALICYLATE LEVEL: Salicylate Lvl: <7 mg/dL (ref 2.8–30.0)

## 2018-10-05 LAB — ACETAMINOPHEN LEVEL: Acetaminophen (Tylenol), Serum: <10 ug/mL — ABNORMAL LOW (ref 10–30)

## 2018-10-05 LAB — SARS CORONAVIRUS 2 BY RT PCR (HOSPITAL ORDER, PERFORMED IN ~~LOC~~ HOSPITAL LAB): SARS Coronavirus 2: NEGATIVE

## 2018-10-05 LAB — ETHANOL: Alcohol, Ethyl (B): <10 mg/dL (ref <10)

## 2018-10-05 MED ORDER — ACETAMINOPHEN 325 MG PO TABS: 650.0000 mg | ORAL_TABLET | ORAL | Status: DC | PRN

## 2018-10-05 MED ORDER — ALUM & MAG HYDROXIDE-SIMETH 200-200-20 MG/5ML PO SUSP: 30.0000 mL | Freq: Four times a day (QID) | ORAL | Status: DC | PRN

## 2018-10-05 MED ORDER — ZOLPIDEM TARTRATE 5 MG PO TABS: 5.0000 mg | ORAL_TABLET | Freq: Every evening | ORAL | Status: DC | PRN

## 2018-10-05 MED ORDER — NICOTINE 21 MG/24HR TD PT24
21.0000 mg | MEDICATED_PATCH | Freq: Every day | TRANSDERMAL | Status: DC
  Administered 2018-10-05: 21 mg via TRANSDERMAL
  Filled 2018-10-05: qty 1

## 2018-10-05 MED ORDER — ONDANSETRON HCL 4 MG PO TABS: 4.0000 mg | ORAL_TABLET | Freq: Three times a day (TID) | ORAL | Status: DC | PRN

## 2018-10-05 NOTE — ED Notes (Signed)
Pt eating provided meal

## 2018-10-05 NOTE — ED Notes (Signed)
Received call from [facility] at [facility] stating pt was accepted by Dr. Eileen Stanford and the number to call report is [phone]. They will need a copy of his covid test faxed to [phone]. Cornelia Copa at [facility] notified of this.

## 2018-10-05 NOTE — BH Assessment (Signed)
Faxed negative COVID result to [place] [phone].   Evelena Peat, [facility], [facility], [facility] Triage Specialist [phone]

## 2018-10-05 NOTE — ED Notes (Signed)
Call to give report  POC Iona Beard states have not received fax for covid  Call to [facility] POC Marijean Bravo who reports fax with received   He will call Rosana Hoes to clarify and/or resend
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
## 2018-10-05 NOTE — ED Notes (Signed)
Attempt to call report.

## 2018-10-05 NOTE — ED Notes (Signed)
Pt accepted at [facility]  Accepting physician Dr Orma Render  They would like a copy of covid

## 2018-10-05 NOTE — ED Notes (Signed)
Third attempt to call report   Staff report they have not received negative Covid report from [facility]

## 2018-10-05 NOTE — ED Notes (Signed)
Awaiting Covid that report might be called and transfer

## 2018-10-05 NOTE — ED Notes (Signed)
Pt with a history of "months long" admission to "[facility]" for pneumonia  Has history of depression but reports no prior outpt/inpt treatment of same   He graduated from high school last year per his report and has no job currently   He is well groomed, soft spoken and meets eye contact  He is placed in [place] his report that he does not "want to be here anymore"

## 2018-10-05 NOTE — Progress Notes (Signed)
CSW informed Lelon Frohlich, RN regarding request for COVID testing from referral facility looking at patient (tentatively [facility]). She stated that this would be done.   Chalmers Guest. Guerry Bruin, MSW, [place] Work/Disposition Phone: [phone] Fax: [phone]

## 2018-10-05 NOTE — Progress Notes (Signed)
Patient meets criteria for inpatient treatment. No appropriate or available beds at [facility]. CSW faxed referrals to the following facilities for review:  [facility]  [facility]  [facility]  [facility]  [facility]  [facility]   TTS will continue to seek bed placement.  Chalmers Guest. Guerry Bruin, MSW, [place] Work/Disposition Phone: [phone] Fax: [phone]

## 2018-10-05 NOTE — ED Notes (Signed)
Report to Santiago Glad, LPN

## 2018-10-05 NOTE — ED Notes (Signed)
TTS 

## 2018-10-05 NOTE — BHH Counselor (Signed)
Pt accepted to [facility].   No bed assignment given. Accepting physician is Dr. Eileen Stanford Please call report to [phone] Pt can arrive any time after they receive a negative COVID test. Per [facility], [facility] will nto release covid test results.  They have asked [place] to do so.  They have asked that the info be faxed to [phone].

## 2018-10-05 NOTE — ED Notes (Signed)
Snacks provided until meal is served

## 2018-10-05 NOTE — ED Notes (Signed)
TTS completed  [place] provided
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
## 2018-10-05 NOTE — ED Notes (Signed)
Pt has been recommended for inpt treatment  He reports that he was "misunderstood" in his statements and "I'm ready to go"  He reports he wants to speak with his mother and is given the phone for same

## 2018-10-05 NOTE — ED Notes (Signed)
Pt informed of acceptance

## 2018-10-05 NOTE — BH Assessment (Signed)
Received call from [place] at [place] results have been received. Notified staff at [place].   Evelena Peat, [facility], [facility], [facility] Triage Specialist [phone]
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
## 2018-10-07 LAB — RPR: RPR Ser Ql: NONREACTIVE

## 2018-10-08 LAB — HIV ANTIBODY (ROUTINE TESTING W REFLEX): HIV Screen 4th Generation wRfx: NONREACTIVE

## 2018-11-16 ENCOUNTER — Emergency Department: Payer: Medicaid Other

## 2018-11-16 ENCOUNTER — Other Ambulatory Visit: Payer: Self-pay

## 2018-11-16 DIAGNOSIS — J45901 Unspecified asthma with (acute) exacerbation: Secondary | ICD-10-CM | POA: Diagnosis not present

## 2018-11-16 DIAGNOSIS — Z20828 Contact with and (suspected) exposure to other viral communicable diseases: Secondary | ICD-10-CM | POA: Insufficient documentation

## 2018-11-16 DIAGNOSIS — Z79899 Other long term (current) drug therapy: Secondary | ICD-10-CM | POA: Diagnosis not present

## 2018-11-16 DIAGNOSIS — J45909 Unspecified asthma, uncomplicated: Secondary | ICD-10-CM | POA: Diagnosis not present

## 2018-11-16 DIAGNOSIS — F17210 Nicotine dependence, cigarettes, uncomplicated: Secondary | ICD-10-CM | POA: Diagnosis not present

## 2018-11-16 DIAGNOSIS — F1210 Cannabis abuse, uncomplicated: Secondary | ICD-10-CM | POA: Diagnosis not present

## 2018-11-16 DIAGNOSIS — R05 Cough: Secondary | ICD-10-CM | POA: Diagnosis present

## 2018-11-16 NOTE — ED Triage Notes (Signed)
Patient reports cough since Thursday.

## 2018-11-17 ENCOUNTER — Emergency Department
Admission: EM | Admit: 2018-11-17 | Discharge: 2018-11-17 | Disposition: A | Discharge status: Home / Self Care | Payer: Medicaid Other | Attending: Emergency Medicine | Admitting: Emergency Medicine

## 2018-11-17 DIAGNOSIS — J45901 Unspecified asthma with (acute) exacerbation: Secondary | ICD-10-CM

## 2018-11-17 MED ORDER — PREDNISONE 10 MG (21) PO TBPK: ORAL_TABLET | ORAL | 0 refills | Status: DC

## 2018-11-17 MED ORDER — ALBUTEROL SULFATE HFA 108 (90 BASE) MCG/ACT IN AERS: 2.0000 | INHALATION_SPRAY | Freq: Four times a day (QID) | RESPIRATORY_TRACT | 1 refills | Status: DC | PRN

## 2018-11-17 NOTE — ED Provider Notes (Signed)
[facility] Emergency Department Provider Note  ____________________________________________   I have reviewed the triage vital signs and the nursing notes.   HISTORY  Chief Complaint Cough   History limited by: Not Limited   HPI Hunter Thompson is a 18 y.o. male who presents to the emergency department today because of concerns for cough and wheezing.  Patient states he has a history of asthma.  For the past few days he is noticed some increased shortness of breath and wheezing.  Says been accompanied by a productive cough.  Patient has felt some chest tightness.  He does not have of the equipment he needs for his nebulizer machine so has not been able to try any treatments at home.  He has been around people with similar illness.  No measured fevers although he has felt feverish.   Records reviewed. Per medical record review patient has a history of asthma. Pneumonia.   Past Medical History:  Diagnosis Date  . Asthma   . Bipolar 1 disorder ([place])   . Depression     Patient Active Problem List   Diagnosis Date Noted  . Pneumonia 03/10/2018    No past surgical history on file.  Prior to Admission medications   Medication Sig Start Date End Date Taking? Authorizing Provider  ibuprofen (ADVIL,MOTRIN) 600 MG tablet Take 600 mg by mouth every 6 (six) hours. 01/17/18   [provider]  levofloxacin (LEVAQUIN) 750 MG tablet Take 1 tablet (750 mg total) by mouth daily. 03/11/18   Bettey Costa, MD    Allergies Patient has no known allergies.  Family History  Problem Relation Age of Onset  . Diabetes Father   . Asthma Mother     Social History Social History   Tobacco Use  . Smoking status: Current Some Day Smoker    Packs/day: 0.50    Types: Cigarettes  . Smokeless tobacco: Never Used  Substance Use Topics  . Alcohol use: Yes    Comment: occ  . Drug use: Yes    Types: Marijuana    Comment: +THC UDS    Review of  Systems Constitutional: No fever/chills Eyes: No visual changes. ENT: No sore throat. Cardiovascular: Denies chest pain. Respiratory: Positive for cough and shortness of breath. Gastrointestinal: No abdominal pain.  No nausea, no vomiting.  No diarrhea.   Genitourinary: Negative for dysuria. Musculoskeletal: Negative for back pain. Skin: Negative for rash. Neurological: Negative for headaches, focal weakness or numbness.  ____________________________________________   PHYSICAL EXAM:  VITAL SIGNS: ED Triage Vitals  Enc Vitals Group     BP 11/16/18 2314 139/62     Pulse Rate 11/16/18 2314 69     Resp 11/16/18 2314 17     Temp 11/16/18 2314 98.5 F (36.9 C)     Temp Source 11/16/18 2314 Oral     SpO2 11/16/18 2314 99 %     Weight 11/16/18 2315 215 lb (97.5 kg)     Height 11/16/18 2315 6\' 2"  (1.88 m)     Head Circumference --      Peak Flow --      Pain Score 11/16/18 2314 0   Constitutional: Alert and oriented.  Eyes: Conjunctivae are normal.  ENT      Head: Normocephalic and atraumatic.      Nose: No congestion/rhinnorhea.      Mouth/Throat: Mucous membranes are moist.      Neck: No stridor. Hematological/Lymphatic/Immunilogical: No cervical lymphadenopathy. Cardiovascular: Normal rate, regular rhythm.  No  murmurs, rubs, or gallops.  Respiratory: Normal respiratory effort without tachypnea nor retractions. Slight expiratory wheezing throughout bilateral lungs.  Gastrointestinal: Soft and non tender. No rebound. No guarding.  Genitourinary: Deferred Musculoskeletal: Normal range of motion in all extremities. No lower extremity edema. Neurologic:  Normal speech and language. No gross focal neurologic deficits are appreciated.  Skin:  Skin is warm, dry and intact. No rash noted. Psychiatric: Mood and affect are normal. Speech and behavior are normal. Patient exhibits appropriate insight and judgment.  ____________________________________________    LABS (pertinent  positives/negatives)  None  ____________________________________________   EKG  None  ____________________________________________    RADIOLOGY  CXR No acute abnormality  ____________________________________________   PROCEDURES  Procedures  ____________________________________________   INITIAL IMPRESSION / ASSESSMENT AND PLAN / ED COURSE  Pertinent labs & imaging results that were available during my care of the patient were reviewed by me and considered in my medical decision making (see chart for details).   Patient presented to the emergency department today because of concerns for some shortness of breath, wheezing and cough.  Patient states he does have a history of asthma.  On exam patient does have diffuse expiratory wheezing.  Patient however does not appear to be in any respiratory distress.  Will plan on giving patient prescription for steroids.  Will give patient prescription for albuterol inhaler.  ___________________________________________   FINAL CLINICAL IMPRESSION(S) / ED DIAGNOSES  Final diagnoses:  Exacerbation of asthma, unspecified asthma severity, unspecified whether persistent     Note: This dictation was prepared with Dragon dictation. Any transcriptional errors that result from this process are unintentional     Phineas Semen, MD 11/17/18 [phone]

## 2018-11-17 NOTE — Discharge Instructions (Addendum)
Please seek medical attention for any high fevers, chest pain, shortness of breath, change in behavior, persistent vomiting, bloody stool or any other new or concerning symptoms.  

## 2018-11-18 LAB — NOVEL CORONAVIRUS, NAA (HOSP ORDER, SEND-OUT TO REF LAB; TAT 18-24 HRS): SARS-CoV-2, NAA: NOT DETECTED

## 2019-04-15 ENCOUNTER — Ambulatory Visit: Payer: Medicaid Other | Attending: Internal Medicine

## 2019-04-15 ENCOUNTER — Other Ambulatory Visit: Payer: Self-pay

## 2019-04-15 DIAGNOSIS — Z20822 Contact with and (suspected) exposure to covid-19: Secondary | ICD-10-CM

## 2019-04-16 LAB — NOVEL CORONAVIRUS, NAA: SARS-CoV-2, NAA: NOT DETECTED

## 2019-07-03 ENCOUNTER — Ambulatory Visit: Payer: Medicaid Other | Attending: Internal Medicine

## 2020-04-06 IMAGING — DX DG CHEST 2V
2 series · 2 of 2 positions shown · non-contrast
Comparison: 03/10/2018

CLINICAL DATA: Chest pain, asthma, smoker

EXAM:
CHEST - 2 VIEW

[chest pa]
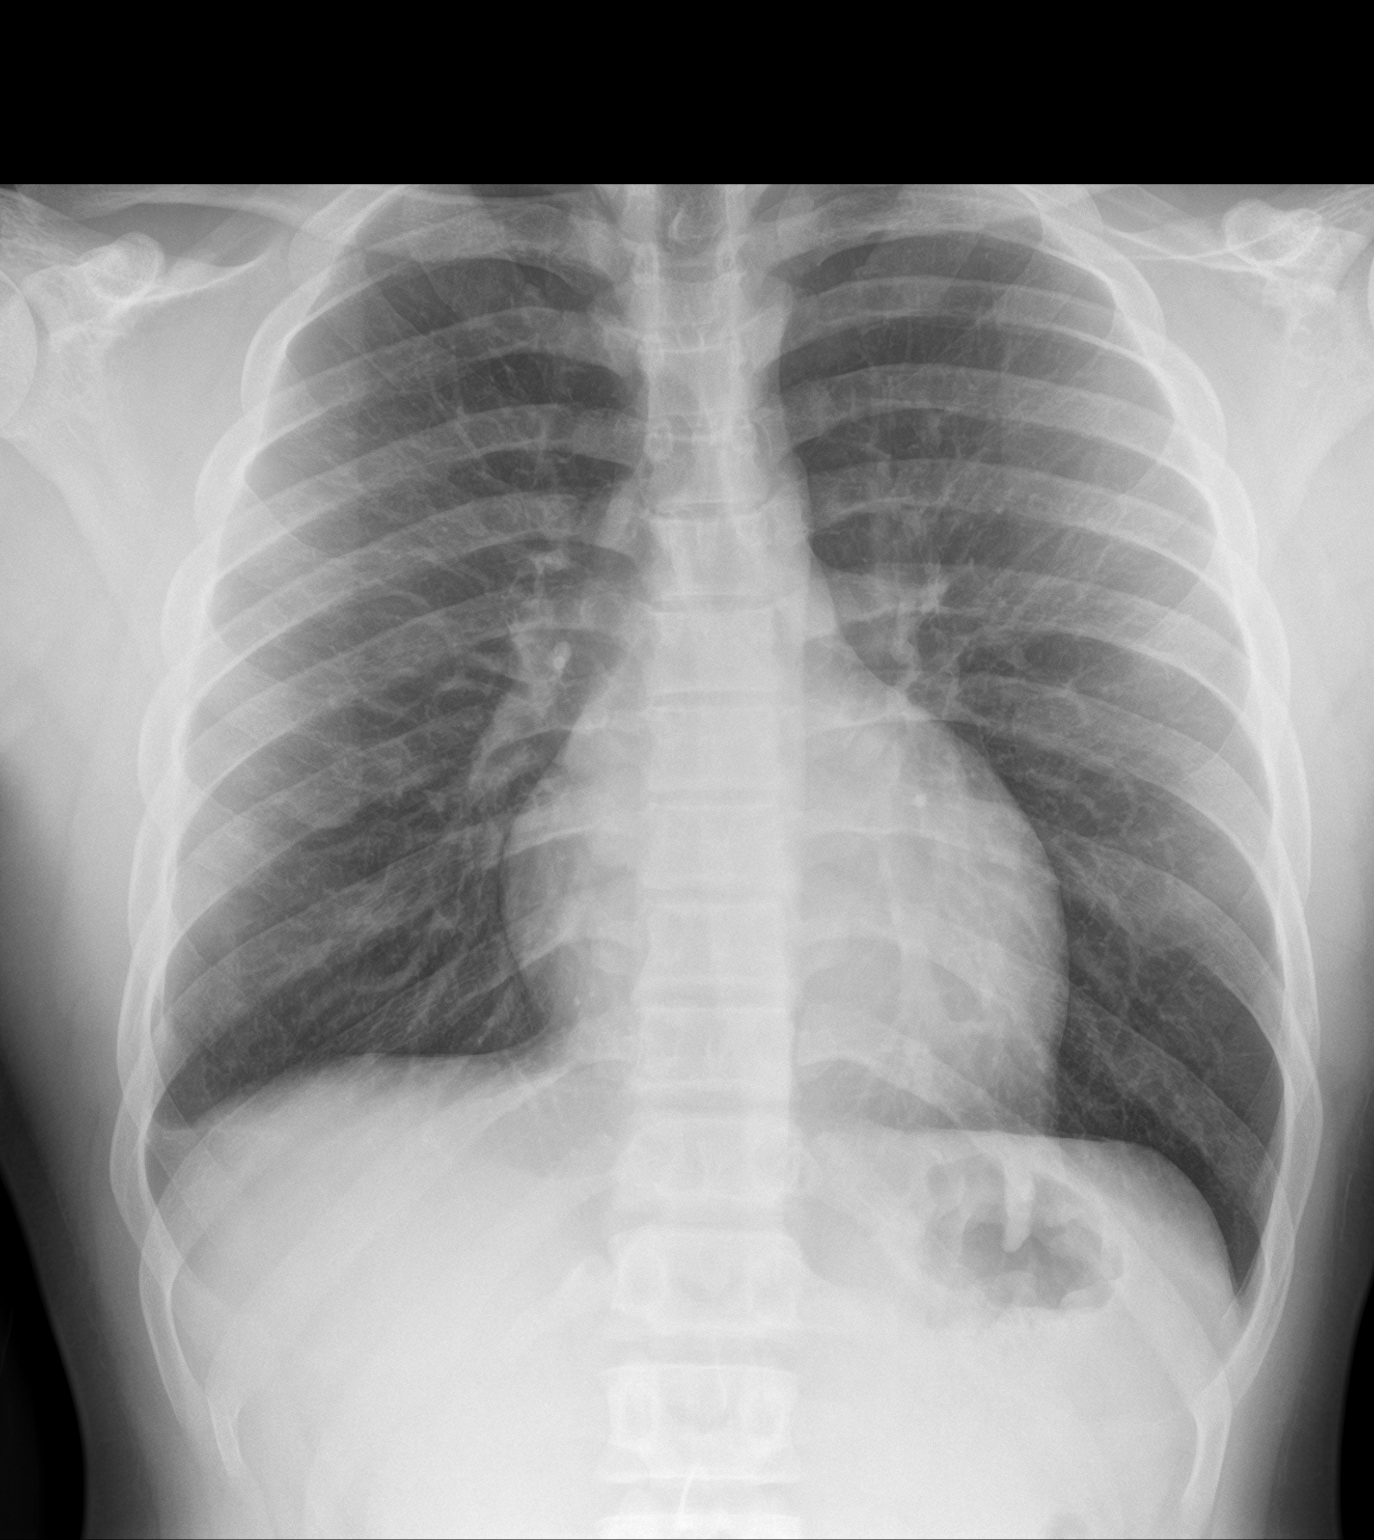

[chest lat]
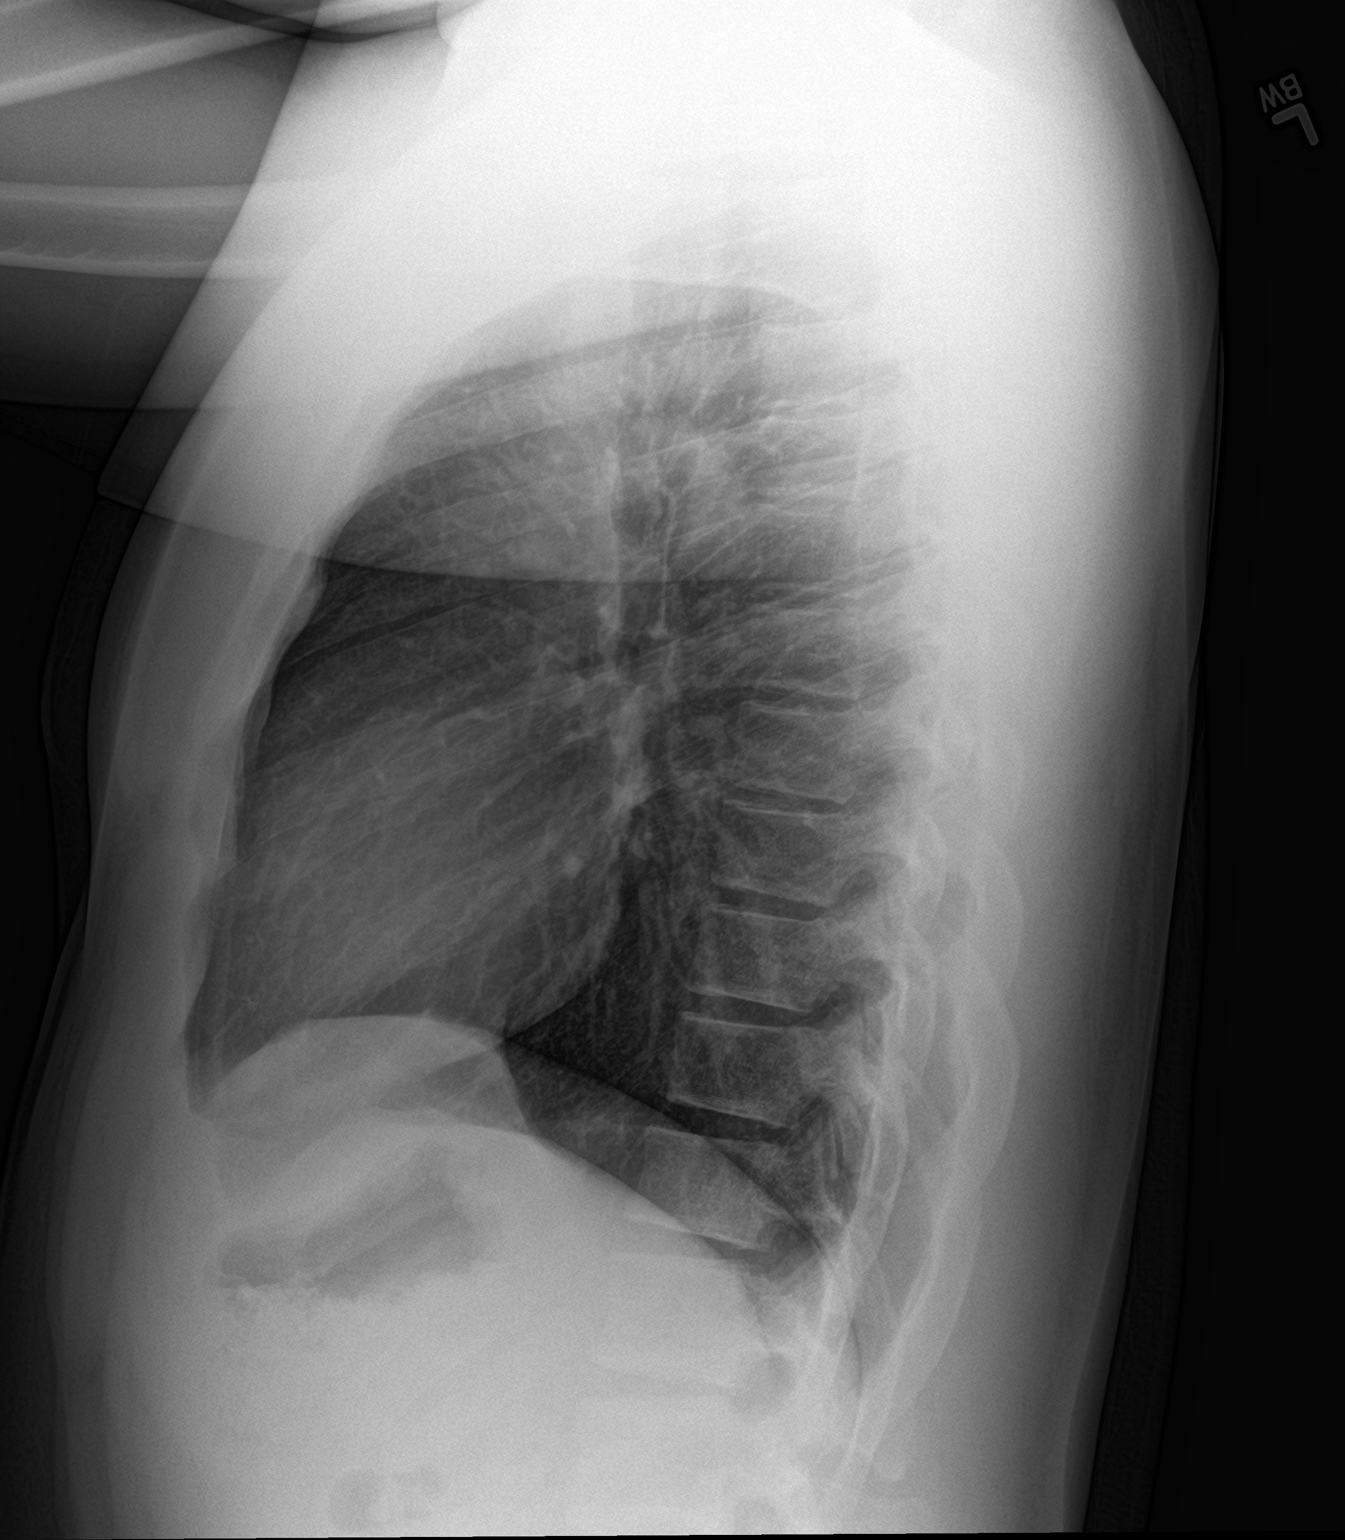

[2 of 2 positions shown; findings below may reference images not displayed]

FINDINGS: Normal heart size, mediastinal contours, and pulmonary vascularity.

Lungs clear.

No pleural effusion or pneumothorax.

Bones unremarkable.
IMPRESSION: Normal exam.

## 2020-05-18 IMAGING — CR DG CHEST 2V
1 series · 2 of 2 positions shown · non-contrast
Comparison: October 05, 2018

CLINICAL DATA: Cough

EXAM:
CHEST - 2 VIEW

[Series 1: dg chest 2 view · 0.14mm/px · 2 of 2 slices shown]
[im 1/2]
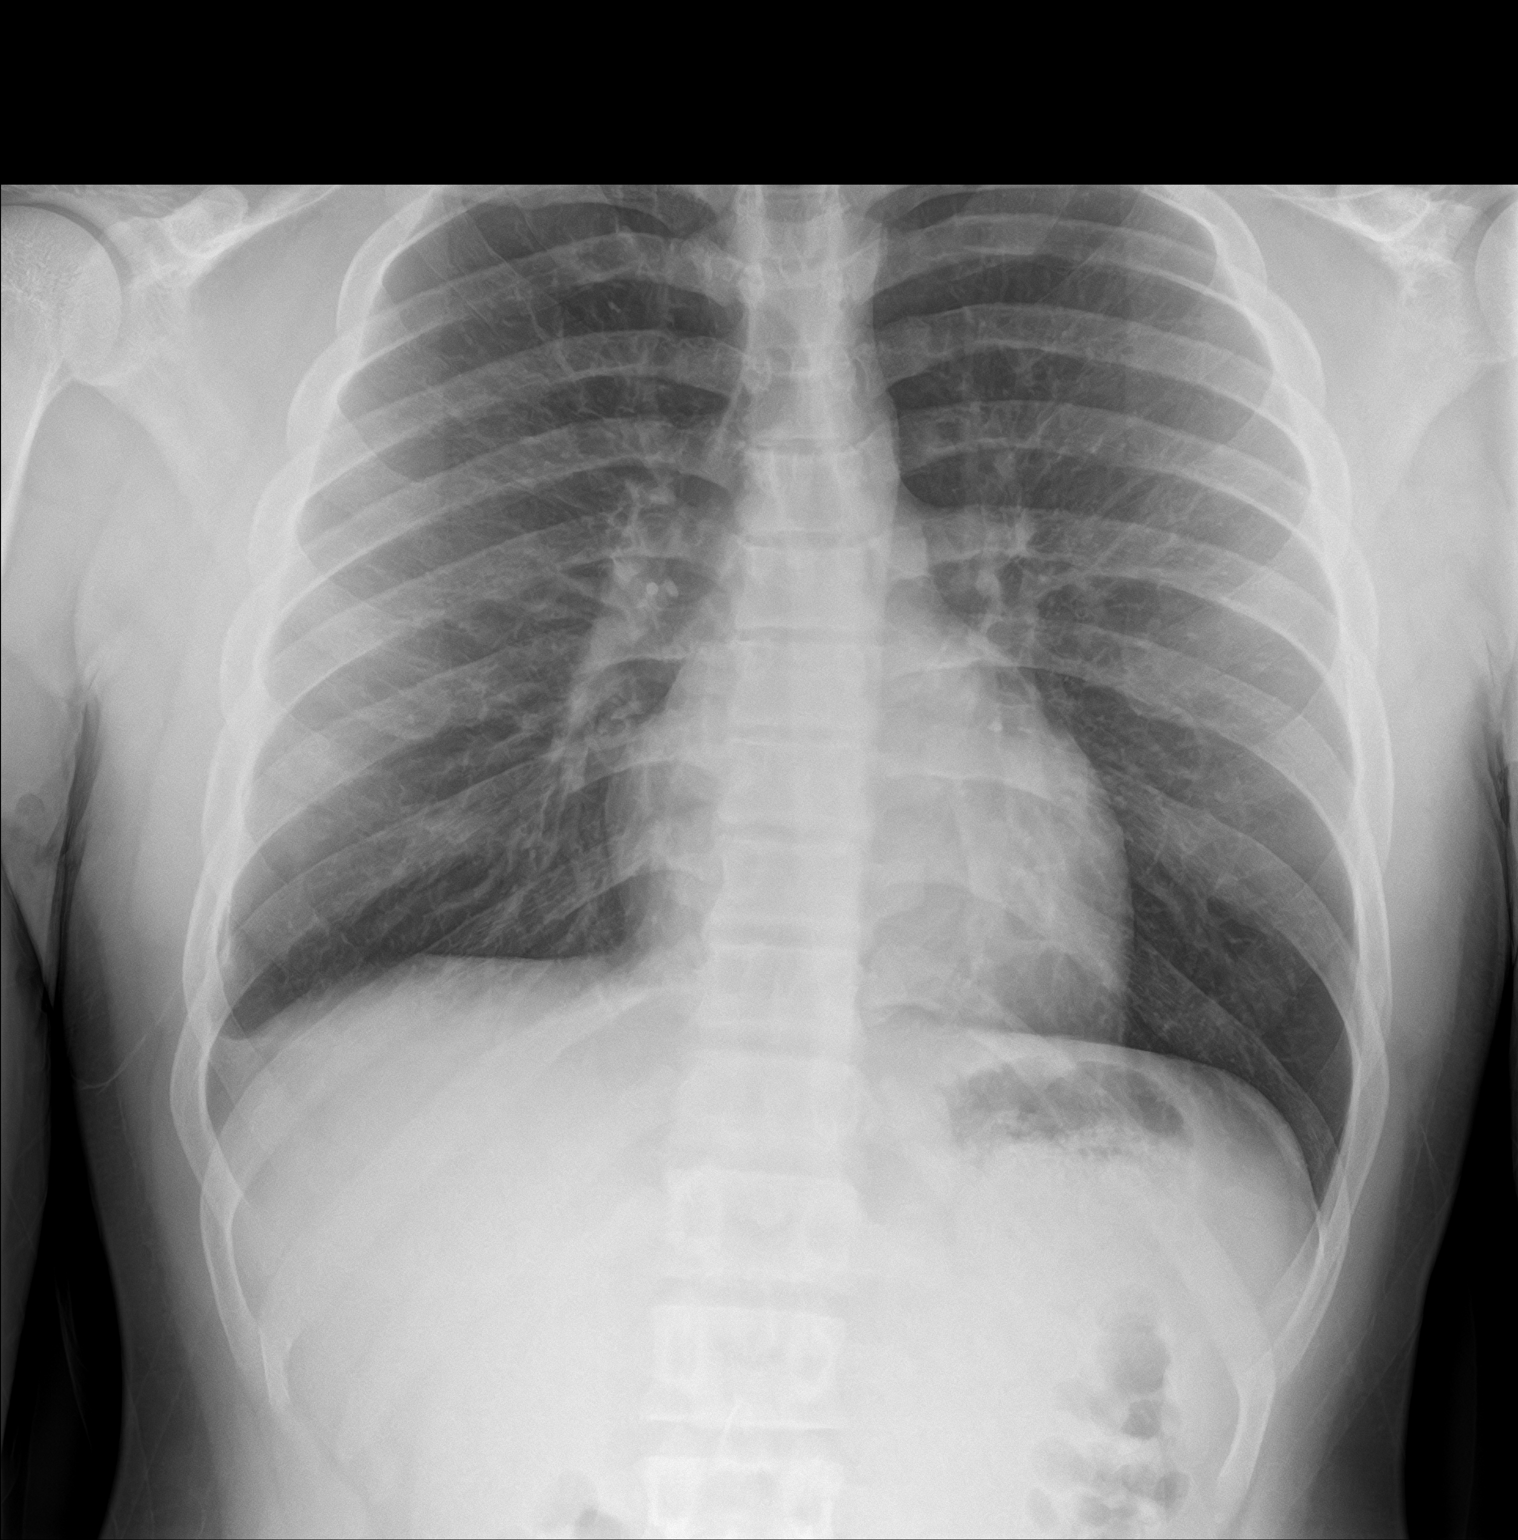
[im 2/2]
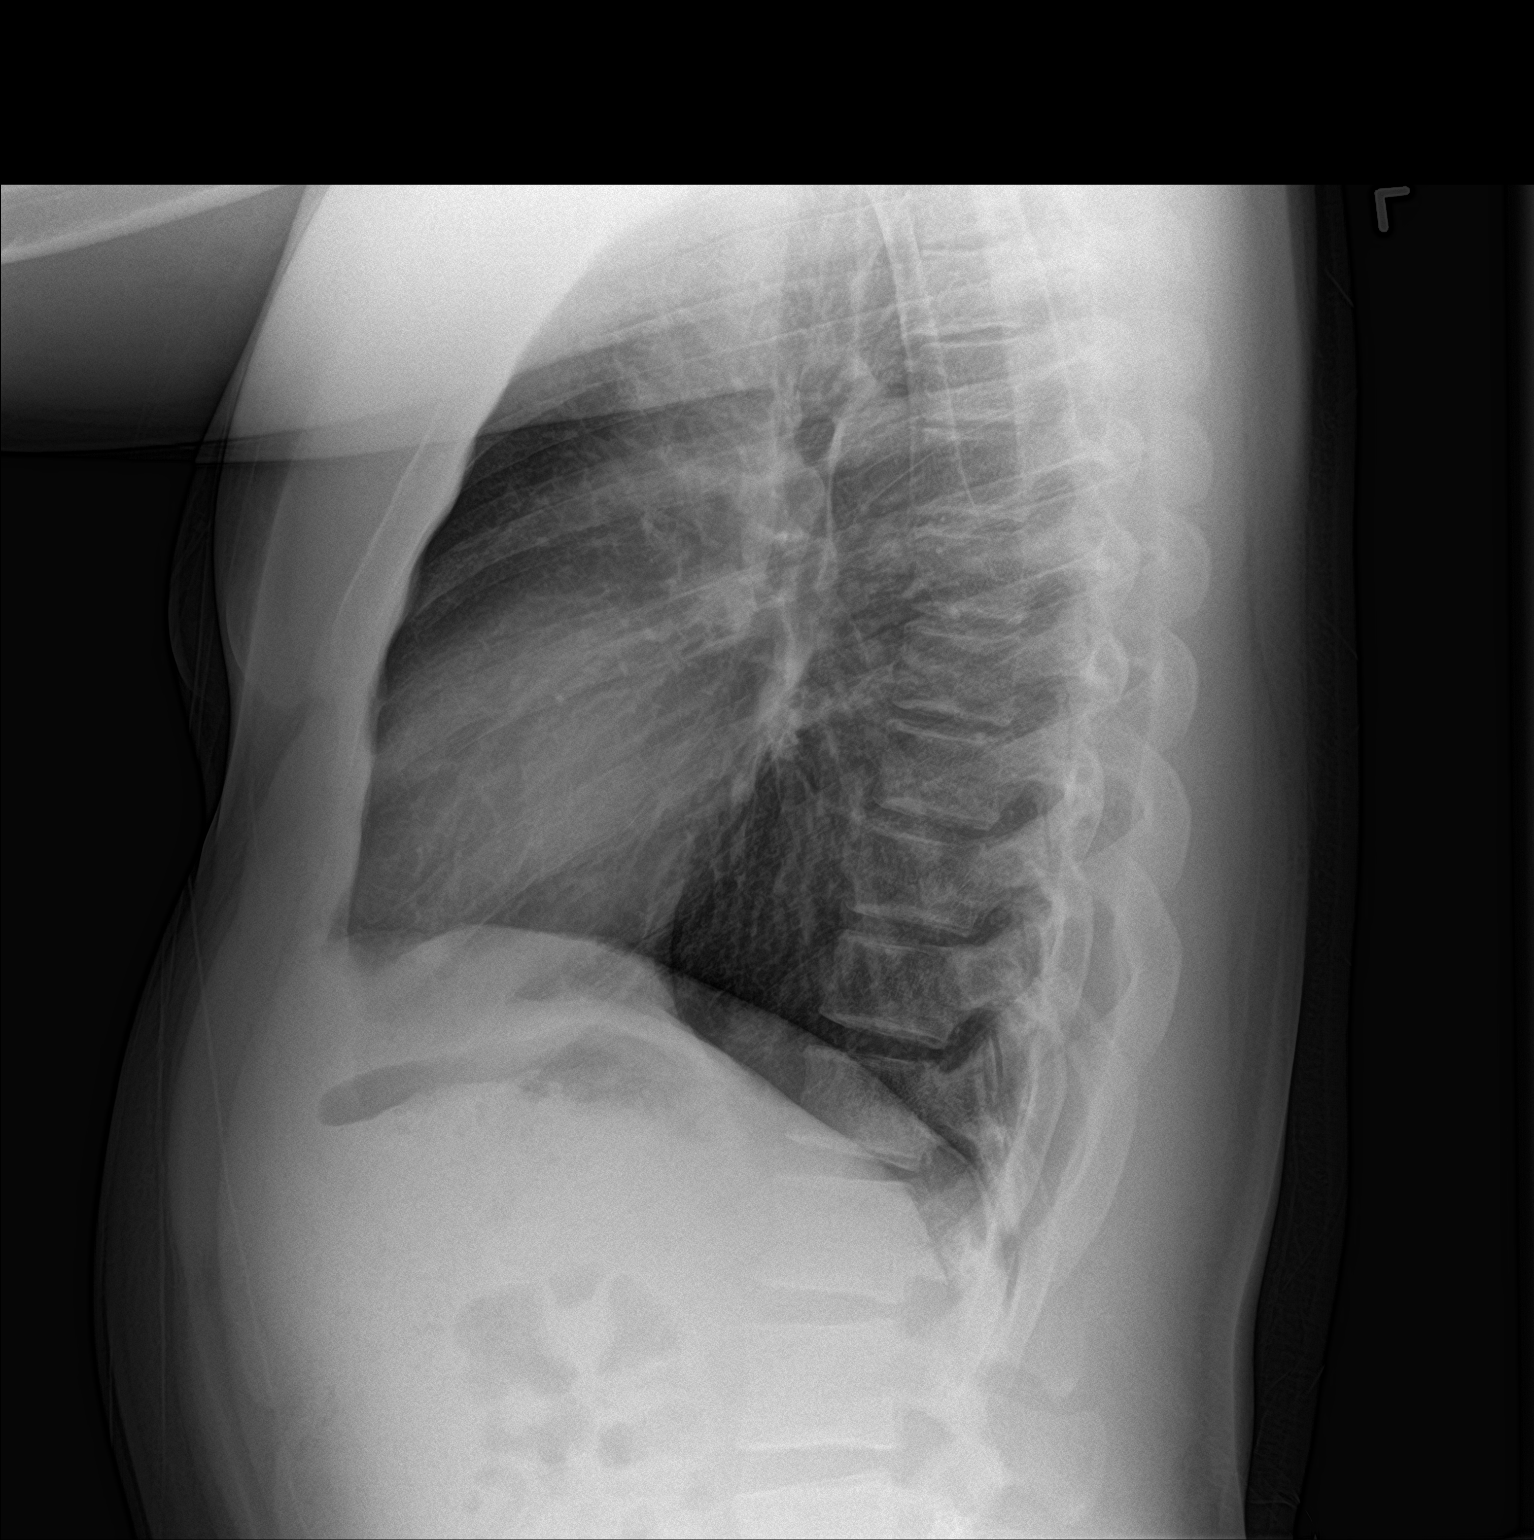

[2 of 2 positions shown; findings below may reference images not displayed]

FINDINGS: The heart size and mediastinal contours are within normal limits.
Both lungs are clear. The visualized skeletal structures are
unremarkable.
IMPRESSION: No active cardiopulmonary disease.

## 2023-04-25 ENCOUNTER — Encounter (HOSPITAL_COMMUNITY): Payer: Self-pay

## 2023-04-25 ENCOUNTER — Ambulatory Visit (HOSPITAL_COMMUNITY)
Admission: EM | Admit: 2023-04-25 | Discharge: 2023-04-25 | Disposition: A | Discharge status: Home / Self Care | Attending: Physician Assistant | Admitting: Physician Assistant

## 2023-04-25 DIAGNOSIS — J069 Acute upper respiratory infection, unspecified: Secondary | ICD-10-CM | POA: Diagnosis not present

## 2023-04-25 LAB — POCT RAPID STREP A (OFFICE): Rapid Strep A Screen: NEGATIVE

## 2023-04-25 LAB — POC COVID19/FLU A&B COMBO
Covid Antigen, POC: NEGATIVE
Influenza A Antigen, POC: NEGATIVE
Influenza B Antigen, POC: NEGATIVE

## 2023-04-25 NOTE — ED Provider Notes (Signed)
 [facility]    CSN: [id number] Arrival date & time: 04/25/23  1817      History   Chief Complaint Chief Complaint  Patient presents with  . Sore Throat  . Letter for School/Work    HPI Hunter Thompson is a 23 y.o. male.   HPI  He reports he and his daughter have been sick  He reports he has been having sore throat, cough He states coughing has been mildly productive  He thinks symptoms are improving since they started on Tuesday    Intervention: Nyquil     Past Medical History:  Diagnosis Date  . Asthma   . Bipolar 1 disorder ([facility])   . Depression     Patient Active Problem List   Diagnosis Date Noted  . Pneumonia 03/10/2018    History reviewed. No pertinent surgical history.     Home Medications    Prior to Admission medications   Medication Sig Start Date End Date Taking? Authorizing Provider  albuterol (VENTOLIN HFA) 108 (90 Base) MCG/ACT inhaler Inhale 2 puffs into the lungs every 6 (six) hours as needed for wheezing or shortness of breath. 11/17/18   Phineas Semen, MD  ibuprofen (ADVIL,MOTRIN) 600 MG tablet Take 600 mg by mouth every 6 (six) hours. 01/17/18   [provider]  levofloxacin (LEVAQUIN) 750 MG tablet Take 1 tablet (750 mg total) by mouth daily. 03/11/18   Adrian Saran, MD  predniSONE (STERAPRED UNI-PAK 21 TAB) 10 MG (21) TBPK tablet Per packaging instructions 11/17/18   Phineas Semen, MD    Family History Family History  Problem Relation Age of Onset  . Diabetes Father   . Asthma Mother     Social History Social History   Tobacco Use  . Smoking status: Former    Current packs/day: 0.50    Types: Cigarettes  . Smokeless tobacco: Never  Vaping Use  . Vaping status: Some Days  . Substances: Nicotine, Flavoring  Substance Use Topics  . Alcohol use: Yes    Comment: occ  . Drug use: Yes    Types: Marijuana    Comment: +THC UDS     Allergies   Patient has no known allergies.   Review of  Systems Review of Systems  Constitutional:  Negative for chills and fever.  HENT:  Positive for sore throat. Negative for congestion, ear pain, postnasal drip, rhinorrhea, sinus pressure and sinus pain.   Respiratory:  Positive for cough (mildly productive). Negative for shortness of breath and wheezing.   Gastrointestinal:  Negative for diarrhea, nausea and vomiting.  Neurological:  Negative for dizziness and headaches.     Physical Exam Triage Vital Signs ED Triage Vitals  Encounter Vitals Group     BP 04/25/23 1932 (!) 142/81     Systolic BP Percentile --      Diastolic BP Percentile --      Pulse Rate 04/25/23 1932 70     Resp 04/25/23 1932 16     Temp 04/25/23 1932 98.2 F (36.8 C)     Temp Source 04/25/23 1932 Oral     SpO2 04/25/23 1932 98 %     Weight 04/25/23 1932 285 lb (129.3 kg)     Height 04/25/23 1932 6\' 2"  (1.88 m)     Head Circumference --      Peak Flow --      Pain Score 04/25/23 1931 0     Pain Loc --      Pain Education --  Exclude from Growth Chart --    No data found.  Updated Vital Signs BP (!) 142/81 (BP Location: Right Arm)   Pulse 70   Temp 98.2 F (36.8 C) (Oral)   Resp 16   Ht 6\' 2"  (1.88 m)   Wt 285 lb (129.3 kg)   SpO2 98%   BMI 36.59 kg/m   Visual Acuity Right Eye Distance:   Left Eye Distance:   Bilateral Distance:    Right Eye Near:   Left Eye Near:    Bilateral Near:     Physical Exam Vitals reviewed.  Constitutional:      General: He is awake.     Appearance: Normal appearance. He is well-developed and well-groomed.  HENT:     Head: Normocephalic and atraumatic.     Right Ear: Hearing, tympanic membrane and ear canal normal.     Left Ear: Hearing, tympanic membrane and ear canal normal.     Mouth/Throat:     Lips: Pink.     Mouth: Mucous membranes are moist.     Pharynx: Oropharynx is clear. Uvula midline. No pharyngeal swelling, oropharyngeal exudate, posterior oropharyngeal erythema, uvula swelling or  postnasal drip.     Tonsils: No tonsillar exudate or tonsillar abscesses.  Eyes:     General: Lids are normal. Gaze aligned appropriately.     Extraocular Movements: Extraocular movements intact.  Cardiovascular:     Rate and Rhythm: Normal rate and regular rhythm.     Heart sounds: Normal heart sounds.  Pulmonary:     Effort: Pulmonary effort is normal.     Breath sounds: Normal breath sounds. No decreased air movement. No decreased breath sounds, wheezing, rhonchi or rales.  Musculoskeletal:     Cervical back: Normal range of motion and neck supple.  Lymphadenopathy:     Head:     Right side of head: No submental, submandibular or preauricular adenopathy.     Left side of head: No submental, submandibular or preauricular adenopathy.     Cervical:     Right cervical: No superficial cervical adenopathy.    Left cervical: No superficial cervical adenopathy.     Upper Body:     Right upper body: No supraclavicular adenopathy.     Left upper body: No supraclavicular adenopathy.  Skin:    General: Skin is warm and dry.  Neurological:     General: No focal deficit present.     Mental Status: He is alert and oriented to person, place, and time.  Psychiatric:        Mood and Affect: Mood normal.        Behavior: Behavior normal. Behavior is cooperative.        Thought Content: Thought content normal.        Judgment: Judgment normal.     UC Treatments / Results  Labs (all labs ordered are listed, but only abnormal results are displayed) Labs Reviewed  POCT RAPID STREP A (OFFICE)  POC COVID19/FLU A&B COMBO    EKG   Radiology No results found.  Procedures Procedures (including critical care time)  Medications Ordered in UC Medications - No data to display  Initial Impression / Assessment and Plan / UC Course  I have reviewed the triage vital signs and the nursing notes.  Pertinent labs & imaging results that were available during my care of the patient were reviewed  by me and considered in my medical decision making (see chart for details).     *** Final  Clinical Impressions(s) / UC Diagnoses   Final diagnoses:  None   Discharge Instructions   None    ED Prescriptions   None    PDMP not reviewed this encounter.

## 2023-04-25 NOTE — Discharge Instructions (Addendum)
 Your testing was negative for flu, COVID, strep.  At this time I suspect you are likely recovering from an upper respiratory tract infection also known as a cold.  Recommend you continue to take medications for symptomatic relief such as DayQuil/NyQuil, TheraFlu, Alka-Seltzer per your preference.  You can also use Flonase to assist with any nasal congestion or runny nose.  Please make sure that you are staying well-hydrated and wearing a mask when you are out in public.  At this time I suspect that you are likely okay to return to work.  If at any point you start to develop fevers and are not responding to medication, difficulty breathing, significant sinus pressure or pain, headaches or dizziness please return here or go to the emergency room for further evaluation and management.

## 2023-04-25 NOTE — ED Triage Notes (Signed)
 Chief Complaint: cough, sore throat. Needing a work note clearing him to return to work tomorrow if testing is negative.   Sick exposure: Yes- daughter has Strep   Onset: This past Tuesday   Prescriptions or OTC medications tried: Yes- Nyquil     with little relief  New foods, medications, or products: No  Recent Travel: No

## 2023-06-12 ENCOUNTER — Other Ambulatory Visit: Payer: Self-pay

## 2023-06-12 ENCOUNTER — Emergency Department (HOSPITAL_COMMUNITY)
Admission: EM | Admit: 2023-06-12 | Discharge: 2023-06-12 | Disposition: A | Discharge status: Home / Self Care | Attending: Emergency Medicine | Admitting: Emergency Medicine

## 2023-06-12 ENCOUNTER — Encounter (HOSPITAL_COMMUNITY): Payer: Self-pay

## 2023-06-12 DIAGNOSIS — K047 Periapical abscess without sinus: Secondary | ICD-10-CM | POA: Diagnosis not present

## 2023-06-12 DIAGNOSIS — K0889 Other specified disorders of teeth and supporting structures: Secondary | ICD-10-CM

## 2023-06-12 MED ORDER — CLINDAMYCIN HCL 300 MG PO CAPS: 300.0000 mg | ORAL_CAPSULE | Freq: Four times a day (QID) | ORAL | 0 refills | Status: AC

## 2023-06-12 MED ORDER — DICLOFENAC SODIUM 75 MG PO TBEC: 75.0000 mg | DELAYED_RELEASE_TABLET | Freq: Two times a day (BID) | ORAL | 0 refills | Status: DC

## 2023-06-12 NOTE — ED Triage Notes (Signed)
 Pt here for bottom left dental pain 2 weeks ago. Pt started on amxocillin. Denies fevers.

## 2023-06-12 NOTE — Discharge Instructions (Signed)
 Please take all antibiotics as directed.  Please stop taking the ibuprofen and amoxicillin you were previously prescribed.  Follow-up closely with your dentist on an outpatient basis.  Return to emergency department immediately for any new or worsening symptoms.

## 2023-06-12 NOTE — ED Notes (Signed)
 EDP at bedside

## 2023-06-12 NOTE — ED Provider Notes (Signed)
 [place] EMERGENCY DEPARTMENT AT [facility] Provider Note   CSN: [id number] Arrival date & time: 06/12/23  1833     History  Chief Complaint  Patient presents with   Dental Pain    Hunter Thompson is a 23 y.o. male.  Patient is a 23 year old male who presents emergency department the chief complaint of left lower dental pain which has been ongoing for approximate the past 2 weeks.  He notes that he was previously placed on 800 mg ibuprofen and amoxicillin.  Patient notes that he has continued to have worsening symptoms.  He denies any associated stridor, dysphagia, drooling, dyspnea.  He has had no associated fever or chills.  He is scheduled to see his dentist soon.  Patient denies any associated abdominal pain, nausea, vomiting, diarrhea.   Dental Pain      Home Medications Prior to Admission medications   Medication Sig Start Date End Date Taking? Authorizing Provider  albuterol  (VENTOLIN  HFA) 108 (90 Base) MCG/ACT inhaler Inhale 2 puffs into the lungs every 6 (six) hours as needed for wheezing or shortness of breath. 11/17/18   Goodman, Graydon, MD  ibuprofen (ADVIL,MOTRIN) 600 MG tablet Take 600 mg by mouth every 6 (six) hours. 01/17/18   [provider]  levofloxacin  (LEVAQUIN ) 750 MG tablet Take 1 tablet (750 mg total) by mouth daily. 03/11/18   Altha Athens, MD  predniSONE  (STERAPRED UNI-PAK 21 TAB) 10 MG (21) TBPK tablet Per packaging instructions 11/17/18   Goodman, Graydon, MD      Allergies    Patient has no known allergies.    Review of Systems   Review of Systems  HENT:         Left lower dental pain  All other systems reviewed and are negative.   Physical Exam Updated Vital Signs BP (!) 161/85   Pulse 66   Temp 98.1 F (36.7 C)   Resp 16   Ht 6\' 2"  (1.88 m)   Wt 129.3 kg   SpO2 95%   BMI 36.59 kg/m  Physical Exam Vitals and nursing note reviewed.  Constitutional:      Appearance: Normal appearance.  HENT:     Head:  Normocephalic and atraumatic.     Nose: Nose normal.     Mouth/Throat:     Mouth: Mucous membranes are moist.     Comments: Mild swelling noted along the left lower gumline, floor mouth is soft, tolerance crease without difficulty, no peritonsillar swelling Eyes:     Extraocular Movements: Extraocular movements intact.     Conjunctiva/sclera: Conjunctivae normal.     Pupils: Pupils are equal, round, and reactive to light.  Cardiovascular:     Rate and Rhythm: Normal rate and regular rhythm.     Pulses: Normal pulses.     Heart sounds: Normal heart sounds.  Pulmonary:     Effort: Pulmonary effort is normal. No respiratory distress.     Breath sounds: Normal breath sounds. No wheezing or rales.  Musculoskeletal:        General: Normal range of motion.     Cervical back: Normal range of motion and neck supple. No rigidity or tenderness.  Lymphadenopathy:     Cervical: No cervical adenopathy.  Skin:    General: Skin is warm and dry.  Neurological:     General: No focal deficit present.     Mental Status: He is alert and oriented to person, place, and time. Mental status is at baseline.  Psychiatric:  Mood and Affect: Mood normal.        Behavior: Behavior normal.        Thought Content: Thought content normal.        Judgment: Judgment normal.     ED Results / Procedures / Treatments   Labs (all labs ordered are listed, but only abnormal results are displayed) Labs Reviewed - No data to display  EKG None  Radiology No results found.  Procedures Procedures    Medications Ordered in ED Medications - No data to display  ED Course/ Medical Decision Making/ A&P                                 Medical Decision Making Patient is doing well at this time and is stable for discharge home.  Discussed with patient we will treat him with a different antibiotic as well as pain medication at this point.  He already has close follow-up scheduled with a dentist on an  outpatient basis.  Vital signs are stable with no indication for sepsis.  He has no indication for acute peritonsillar abscess, Ludwig's angina, retropharyngeal abscess, epiglottitis.  He has no signs of acute airway compromise.  There is no indication for an obvious drainable abscess.  Strict return precautions were provided for any new or worsening symptoms.  Patient voiced understanding to the plan and had no additional questions.           Final Clinical Impression(s) / ED Diagnoses Final diagnoses:  None    Rx / DC Orders ED Discharge Orders     None         Emmalene Hare 06/12/23 2117    Almond Army, MD 06/13/23 2102

## 2023-07-10 ENCOUNTER — Ambulatory Visit (HOSPITAL_COMMUNITY)
Admission: EM | Admit: 2023-07-10 | Discharge: 2023-07-10 | Disposition: A | Discharge status: Home / Self Care | Attending: Internal Medicine | Admitting: Internal Medicine

## 2023-07-10 ENCOUNTER — Encounter (HOSPITAL_COMMUNITY): Payer: Self-pay

## 2023-07-10 DIAGNOSIS — K029 Dental caries, unspecified: Secondary | ICD-10-CM | POA: Diagnosis not present

## 2023-07-10 DIAGNOSIS — K047 Periapical abscess without sinus: Secondary | ICD-10-CM

## 2023-07-10 DIAGNOSIS — K0889 Other specified disorders of teeth and supporting structures: Secondary | ICD-10-CM

## 2023-07-10 MED ORDER — IBUPROFEN 800 MG PO TABS: 800.0000 mg | ORAL_TABLET | Freq: Three times a day (TID) | ORAL | 0 refills | Status: DC

## 2023-07-10 MED ORDER — LIDOCAINE VISCOUS HCL 2 % MT SOLN: 5.0000 mL | OROMUCOSAL | 0 refills | Status: DC | PRN

## 2023-07-10 MED ORDER — AMOXICILLIN-POT CLAVULANATE 875-125 MG PO TABS: 1.0000 | ORAL_TABLET | Freq: Two times a day (BID) | ORAL | 0 refills | Status: DC

## 2023-07-10 NOTE — Discharge Instructions (Addendum)
 Your dental pain is likely due to dental infection. Take  antibiotic as prescribed for the next 7 days to treat your dental infection. Continue use of ibuprofen as needed with food for dental inflammation and pain.   You may also use tylenol as needed for pain. Perform salt water gargles every 3-4 hours.  Schedule an appointment with one of the dentists on the list provided to urgent care today.  If you develop any new or worsening symptoms or if your symptoms do not start to improve, pleases return here or follow-up with your primary care provider. If your symptoms are severe, please go to the emergency room.

## 2023-07-10 NOTE — ED Provider Notes (Signed)
 [facility]    CSN: [id number] Arrival date & time: 07/10/23  1600      History   Chief Complaint Chief Complaint  Patient presents with   Dental Pain    HPI Hunter Thompson is a 23 y.o. male.   Patient presents to urgent care for evaluation of pain and cold sensitivity to tooth of the left lower mouth that started a few days ago.  He has been treated with amoxicillin and clindamycin  in the last 6 weeks for pain to various teeth in the mouth.  Most recently treated with clindamycin  1 month ago starting on Jun 12, 2023 as prescribed by the emergency department.  This helped dental pain initially, however pain returned 2 to 3 days ago.  He denies facial swelling, difficulty swallowing, fever, chills, nausea, vomiting, and redness/swelling to the left cheek. He does not see a dentist regularly for dental cleanings.  He was able to see an urgent dentist who recommended dental extraction and root canal of multiple teeth of the mouth.  Patient is unable to afford procedures at this time. He has been taking ibuprofen for pain with minimal relief.   Dental Pain   Past Medical History:  Diagnosis Date   Asthma    Bipolar 1 disorder [facility])    Depression     Patient Active Problem List   Diagnosis Date Noted   Pneumonia 03/10/2018    History reviewed. No pertinent surgical history.     Home Medications    Prior to Admission medications   Medication Sig Start Date End Date Taking? Authorizing Provider  amoxicillin-clavulanate (AUGMENTIN) 875-125 MG tablet Take 1 tablet by mouth every 12 (twelve) hours. 07/10/23  Yes Starlene Eaton, FNP  ibuprofen (ADVIL) 800 MG tablet Take 1 tablet (800 mg total) by mouth 3 (three) times daily. 07/10/23  Yes Starlene Eaton, FNP  lidocaine (XYLOCAINE) 2 % solution Use as directed 5 mLs in the mouth or throat as needed for mouth pain. 07/10/23  Yes Elyse Prevo, Danny Dye, FNP    Family History Family History  Problem  Relation Age of Onset   Diabetes Father    Asthma Mother     Social History Social History   Tobacco Use   Smoking status: Former    Current packs/day: 0.50    Types: Cigarettes   Smokeless tobacco: Never  Vaping Use   Vaping status: Never Used  Substance Use Topics   Alcohol use: Yes    Comment: occ   Drug use: Not Currently    Types: Marijuana    Comment: +THC UDS     Allergies   Patient has no known allergies.   Review of Systems Review of Systems Per HPI  Physical Exam Triage Vital Signs ED Triage Vitals  Encounter Vitals Group     BP 07/10/23 1611 130/85     Systolic BP Percentile --      Diastolic BP Percentile --      Pulse Rate 07/10/23 1611 78     Resp 07/10/23 1611 20     Temp 07/10/23 1611 97.9 F (36.6 C)     Temp Source 07/10/23 1611 Oral     SpO2 07/10/23 1611 98 %     Weight --      Height --      Head Circumference --      Peak Flow --      Pain Score 07/10/23 1612 10     Pain Loc --  Pain Education --      Exclude from Growth Chart --    No data found.  Updated Vital Signs BP 130/85 (BP Location: Left Arm)   Pulse 78   Temp 97.9 F (36.6 C) (Oral)   Resp 20   SpO2 98%   Visual Acuity Right Eye Distance:   Left Eye Distance:   Bilateral Distance:    Right Eye Near:   Left Eye Near:    Bilateral Near:     Physical Exam Vitals and nursing note reviewed.  Constitutional:      Appearance: He is not ill-appearing or toxic-appearing.  HENT:     Head: Normocephalic and atraumatic.     Right Ear: Hearing, tympanic membrane, ear canal and external ear normal.     Left Ear: Hearing, tympanic membrane, ear canal and external ear normal.     Nose: Nose normal.     Mouth/Throat:     Lips: Pink.     Mouth: Mucous membranes are moist. No injury or oral lesions.     Dentition: Abnormal dentition. Dental tenderness and dental caries present. No gingival swelling or dental abscesses.     Tongue: No lesions.     Pharynx:  Oropharynx is clear. Uvula midline. No pharyngeal swelling, oropharyngeal exudate, posterior oropharyngeal erythema, uvula swelling or postnasal drip.     Tonsils: No tonsillar exudate.      Comments: Multiple missing teeth to the mouth.  No palpable dental abscess.  No overlying soft tissue swelling or erythema to the left cheek. Eyes:     General: Lids are normal. Vision grossly intact. Gaze aligned appropriately.     Extraocular Movements: Extraocular movements intact.     Conjunctiva/sclera: Conjunctivae normal.  Neck:     Trachea: Trachea and phonation normal.  Pulmonary:     Effort: Pulmonary effort is normal.  Musculoskeletal:     Cervical back: Neck supple.  Lymphadenopathy:     Cervical: No cervical adenopathy.  Skin:    General: Skin is warm and dry.     Capillary Refill: Capillary refill takes less than 2 seconds.     Findings: No rash.  Neurological:     General: No focal deficit present.     Mental Status: He is alert and oriented to person, place, and time. Mental status is at baseline.     Cranial Nerves: No dysarthria or facial asymmetry.  Psychiatric:        Mood and Affect: Mood normal.        Speech: Speech normal.        Behavior: Behavior normal.        Thought Content: Thought content normal.        Judgment: Judgment normal.      UC Treatments / Results  Labs (all labs ordered are listed, but only abnormal results are displayed) Labs Reviewed - No data to display  EKG   Radiology No results found.  Procedures Procedures (including critical care time)  Medications Ordered in UC Medications - No data to display  Initial Impression / Assessment and Plan / UC Course  I have reviewed the triage vital signs and the nursing notes.  Pertinent labs & imaging results that were available during my care of the patient were reviewed by me and considered in my medical decision making (see chart for details).   1.  Dental pain, pain due to dental  caries, dental infection Evaluation suggests dental pain secondary to dental infection.  Reviewed ER  note from Jun 12, 2023.  HEENT exam stable and without red flag signs indicating need for advanced imaging/further emergent workup. Patient is afebrile, nontoxic in appearance, and with hemodynamically stable vital signs.   Recommend supportive care for symptomatic relief as outlined in AVS.   Information for low cost community dental resources provided.  Encouraged to follow-up with dentist for further management.   Counseled patient on potential for adverse effects with medications prescribed/recommended today, strict ER and return-to-clinic precautions discussed, patient verbalized understanding.    Final Clinical Impressions(s) / UC Diagnoses   Final diagnoses:  Pain, dental  Pain due to dental caries  Dental infection     Discharge Instructions      Your dental pain is likely due to dental infection. Take  antibiotic as prescribed for the next 7 days to treat your dental infection. Continue use of ibuprofen as needed with food for dental inflammation and pain.   You may also use tylenol  as needed for pain. Perform salt water gargles every 3-4 hours.  Schedule an appointment with one of the dentists on the list provided to urgent care today.  If you develop any new or worsening symptoms or if your symptoms do not start to improve, pleases return here or follow-up with your primary care provider. If your symptoms are severe, please go to the emergency room.  ED Prescriptions     Medication Sig Dispense Auth. Provider   amoxicillin-clavulanate (AUGMENTIN) 875-125 MG tablet Take 1 tablet by mouth every 12 (twelve) hours. 14 tablet Shella Devoid M, FNP   ibuprofen (ADVIL) 800 MG tablet Take 1 tablet (800 mg total) by mouth 3 (three) times daily. 21 tablet Starlene Eaton, FNP   lidocaine (XYLOCAINE) 2 % solution Use as directed 5 mLs in the mouth or throat as  needed for mouth pain. 100 mL Starlene Eaton, FNP      PDMP not reviewed this encounter.   Starlene Eaton, [place] 07/10/23 [phone]

## 2023-07-10 NOTE — ED Triage Notes (Signed)
 Pt c/o lt upper/lower toothache x44month that is now causes a headache. States taking tylenol  and ibuprofen with little relief. States saw his dentist twice and unsure where the pain is coming from. States was given pain meds from the ED and its not helping.

## 2023-09-15 ENCOUNTER — Encounter (HOSPITAL_COMMUNITY): Payer: Self-pay | Admitting: *Deleted

## 2023-09-15 ENCOUNTER — Ambulatory Visit (HOSPITAL_COMMUNITY)
Admission: EM | Admit: 2023-09-15 | Discharge: 2023-09-15 | Disposition: A | Discharge status: Home / Self Care | Attending: Emergency Medicine | Admitting: Emergency Medicine

## 2023-09-15 ENCOUNTER — Other Ambulatory Visit: Payer: Self-pay

## 2023-09-15 DIAGNOSIS — Z20822 Contact with and (suspected) exposure to covid-19: Secondary | ICD-10-CM

## 2023-09-15 DIAGNOSIS — J069 Acute upper respiratory infection, unspecified: Secondary | ICD-10-CM | POA: Diagnosis not present

## 2023-09-15 LAB — POC SARS CORONAVIRUS 2 AG -  ED: SARS Coronavirus 2 Ag: NEGATIVE

## 2023-09-15 MED ORDER — IBUPROFEN 800 MG PO TABS: 800.0000 mg | ORAL_TABLET | Freq: Three times a day (TID) | ORAL | 0 refills | Status: DC

## 2023-09-15 MED ORDER — ACETAMINOPHEN 500 MG PO TABS: 500.0000 mg | ORAL_TABLET | Freq: Four times a day (QID) | ORAL | 0 refills | Status: AC | PRN

## 2023-09-15 NOTE — Discharge Instructions (Addendum)
 Your COVID testing was negative, you most likely have a different viral illness.  Please alternate between Tylenol  and ibuprofen  every 4-6 hours to help with fever, body aches and chills.  Over-the-counter Mucinex  can help loosen secretions in addition to at least 64 ounces of water daily.    Symptoms should improve over the next week or so, if no improvement or any changes follow-up with your primary care provider or return to clinic for reevaluation.

## 2023-09-15 NOTE — ED Provider Notes (Signed)
 [facility]    CSN: [id number] Arrival date & time: 09/15/23  1359      History   Chief Complaint Chief Complaint  Patient presents with   possible COVID    HPI Hunter Thompson is a 23 y.o. male.   Patient presents to clinic over concern of congestion, rhinorrhea, bilateral itching eyes and sneezing for the past few days.  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  Has not had any wheezing or shortness of breath.  Denies fevers.  Has not tried medicine or interventions for his symptoms.  The history is provided by the patient and medical records.    Past Medical History:  Diagnosis Date   Asthma    Bipolar 1 disorder [facility])    Depression     Patient Active Problem List   Diagnosis Date Noted   Pneumonia 03/10/2018    History reviewed. No pertinent surgical history.     Home Medications    Prior to Admission medications   Medication Sig Start Date End Date Taking? Authorizing Provider  acetaminophen  (TYLENOL ) 500 MG tablet Take 1 tablet (500 mg total) by mouth every 6 (six) hours as needed. 09/15/23  Yes Taaliyah Delpriore  N, FNP  ibuprofen  (ADVIL ) 800 MG tablet Take 1 tablet (800 mg total) by mouth 3 (three) times daily. 09/15/23  Yes Dreama, Shiheem Corporan  N, FNP    Family History Family History  Problem Relation Age of Onset   Diabetes Father    Asthma Mother     Social History Social History   Tobacco Use   Smoking status: Former    Current packs/day: 0.50    Types: Cigarettes   Smokeless tobacco: Never  Vaping Use   Vaping status: Never Used  Substance Use Topics   Alcohol use: Yes    Comment: occ   Drug use: Not Currently    Types: Marijuana    Comment: +THC UDS     Allergies   Patient has no known allergies.   Review of Systems Review of Systems  Per HPI  Physical Exam Triage Vital Signs ED Triage Vitals [09/15/23 1508]  Encounter Vitals Group     BP 137/78     Girls Systolic BP  Percentile      Girls Diastolic BP Percentile      Boys Systolic BP Percentile      Boys Diastolic BP Percentile      Pulse Rate 79     Resp 20     Temp 97.9 F (36.6 C)     Temp src      SpO2 96 %     Weight      Height      Head Circumference      Peak Flow      Pain Score      Pain Loc      Pain Education      Exclude from Growth Chart    No data found.  Updated Vital Signs BP 137/78   Pulse 79   Temp 97.9 F (36.6 C)   Resp 20   SpO2 96%   Visual Acuity Right Eye Distance:   Left Eye Distance:   Bilateral Distance:    Right Eye Near:   Left Eye Near:    Bilateral Near:     Physical Exam Vitals and nursing note reviewed.  Constitutional:      Appearance: Normal appearance.  HENT:  Head: Normocephalic and atraumatic.     Right Ear: External ear normal.     Left Ear: External ear normal.     Nose: Congestion and rhinorrhea present.     Mouth/Throat:     Mouth: Mucous membranes are moist.     Pharynx: Posterior oropharyngeal erythema present.  Cardiovascular:     Rate and Rhythm: Normal rate and regular rhythm.     Heart sounds: Normal heart sounds. No murmur heard. Pulmonary:     Effort: Pulmonary effort is normal. No respiratory distress.     Breath sounds: No wheezing.  Skin:    General: Skin is warm and dry.  Neurological:     General: No focal deficit present.     Mental Status: He is alert and oriented to person, place, and time.  Psychiatric:        Mood and Affect: Mood normal.        Behavior: Behavior normal.      UC Treatments / Results  Labs (all labs ordered are listed, but only abnormal results are displayed) Labs Reviewed  POC SARS CORONAVIRUS 2 AG -  ED    EKG   Radiology No results found.  Procedures Procedures (including critical care time)  Medications Ordered in UC Medications - No data to display  Initial Impression / Assessment and Plan / UC Course  I have reviewed the triage vital signs and the nursing  notes.  Pertinent labs & imaging results that were available during my care of the patient were reviewed by me and considered in my medical decision making (see chart for details).  Vitals and triage reviewed, patient is hemodynamically stable.  Lungs vesicular, heart with regular rate and rhythm.  Congestion, rhinorrhea postnasal drip present.  POC COVID testing negative, suspect other viral URI.  Symptomatic management discussed.  Plan of care, follow-up care return precautions given, no questions at this time.  Work note provided.     Final Clinical Impressions(s) / UC Diagnoses   Final diagnoses:  Contact with and (suspected) exposure to covid-19  Viral URI     Discharge Instructions      Your COVID testing was negative, you most likely have a different viral illness.  Please alternate between Tylenol  and ibuprofen  every 4-6 hours to help with fever, body aches and chills.  Over-the-counter Mucinex  can help loosen secretions in addition to at least 64 ounces of water daily.    Symptoms should improve over the next week or so, if no improvement or any changes follow-up with your primary care provider or return to clinic for reevaluation.    ED Prescriptions     Medication Sig Dispense Auth. Provider   acetaminophen  (TYLENOL ) 500 MG tablet Take 1 tablet (500 mg total) by mouth every 6 (six) hours as needed. 30 tablet Dreama, Danett Palazzo  N, FNP   ibuprofen  (ADVIL ) 800 MG tablet Take 1 tablet (800 mg total) by mouth 3 (three) times daily. 21 tablet Dreama, Aundre Hietala  N, FNP      PDMP not reviewed this encounter.   Dreama Ovidio SAILOR, FNP 09/15/23 1546

## 2023-09-15 NOTE — ED Triage Notes (Signed)
 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 Pt has congestion,cough ,itching eyes . PT unsure when he was near camper.

## 2023-09-25 ENCOUNTER — Emergency Department (HOSPITAL_COMMUNITY)
Admission: EM | Admit: 2023-09-25 | Discharge: 2023-09-26 | Discharge status: Against Medical Advice | Attending: Emergency Medicine | Admitting: Emergency Medicine

## 2023-09-25 ENCOUNTER — Other Ambulatory Visit: Payer: Self-pay

## 2023-09-25 DIAGNOSIS — K0889 Other specified disorders of teeth and supporting structures: Secondary | ICD-10-CM | POA: Diagnosis present

## 2023-09-25 DIAGNOSIS — Z5329 Procedure and treatment not carried out because of patient's decision for other reasons: Secondary | ICD-10-CM | POA: Diagnosis not present

## 2023-09-25 NOTE — ED Triage Notes (Signed)
 Pt is having left lower wisdom tooth pain. Has not been to dentist for issue. Pain started 1 week ago. Taking otc meds with no help. Pt took tylenol  and motrin  one hour ago.

## 2023-09-26 MED ORDER — NAPROXEN 500 MG PO TABS: 500.0000 mg | ORAL_TABLET | Freq: Two times a day (BID) | ORAL | 0 refills | Status: AC

## 2023-09-26 MED ORDER — KETOROLAC TROMETHAMINE 15 MG/ML IJ SOLN
15.0000 mg | Freq: Once | INTRAMUSCULAR | Status: AC
  Administered 2023-09-26: 15 mg via INTRAMUSCULAR
  Filled 2023-09-26: qty 1

## 2023-09-26 NOTE — ED Notes (Signed)
 Pt name called to see provider, no response

## 2023-09-26 NOTE — ED Provider Notes (Signed)
 [place] EMERGENCY DEPARTMENT AT [facility] Provider Note   CSN: [id number] Arrival date & time: 09/25/23  2210     Patient presents with: Dental Pain   Hunter Thompson is a 23 y.o. male.  Patient presents the emergency room complaining of left sided lower dental pain.  He states the pain is been ongoing for 1 week.  He states that he has not seen a dentist recently but had previously seen one who reportedly had a hard time numbing his mouth.  He has not seen an oral Careers adviser.  He states he has problems with wisdom teeth.  He took Tylenol  and Motrin  prior to coming to the emergency department.  He denies fever, nausea, vomiting, other systemic symptoms.    Dental Pain      Prior to Admission medications   Medication Sig Start Date End Date Taking? Authorizing Provider  naproxen  (NAPROSYN ) 500 MG tablet Take 1 tablet (500 mg total) by mouth 2 (two) times daily. 09/26/23  Yes Logan Ubaldo NOVAK, PA-C  acetaminophen  (TYLENOL ) 500 MG tablet Take 1 tablet (500 mg total) by mouth every 6 (six) hours as needed. 09/15/23   Dreama Scherrie SAILOR, FNP    Allergies: Patient has no known allergies.    Review of Systems  Updated Vital Signs BP (!) 144/89 (BP Location: Right Arm)   Pulse 60   Temp 98.7 F (37.1 C) (Oral)   Resp 18   Ht 6' 2 (1.88 m)   Wt 122.5 kg   SpO2 100%   BMI 34.67 kg/m   Physical Exam Vitals and nursing note reviewed.  HENT:     Head: Normocephalic and atraumatic.     Mouth/Throat:     Comments: Left sided lower molar region with no obvious fractured tooth, erythema, dental abscess. Eyes:     Conjunctiva/sclera: Conjunctivae normal.  Pulmonary:     Effort: Pulmonary effort is normal. No respiratory distress.  Musculoskeletal:        General: No signs of injury.     Cervical back: Normal range of motion.  Skin:    General: Skin is dry.  Neurological:     Mental Status: He is alert.  Psychiatric:        Speech: Speech normal.        Behavior:  Behavior normal.     (all labs ordered are listed, but only abnormal results are displayed) Labs Reviewed - No data to display  EKG: None  Radiology: No results found.   Procedures   Medications Ordered in the ED  ketorolac  (TORADOL ) 15 MG/ML injection 15 mg (15 mg Intramuscular Given 09/26/23 0500)                                    Medical Decision Making Risk Prescription drug management.   Patient presents to the emergency room with a chief complaint of dental pain.  Differential includes broken tooth, impacted wisdom tooth, dental infection, and others  No obvious broken tooth or infection on physical exam.  I see no indication for emergent imaging at this time.  Patient states he has a dentist that he can follow-up with.  Patient treated with Toradol  this evening with some improvement in pain.  Plan to discharge home with prescription for Naprosyn  and recommendations for dental follow-up.  Patient may also take Tylenol  for breakthrough pain.     Final diagnoses:  Pain, dental  ED Discharge Orders          Ordered    naproxen  (NAPROSYN ) 500 MG tablet  2 times daily        09/26/23 0428               Logan Ubaldo KATHEE DEVONNA 09/26/23 0511    Raford Lenis, MD 09/26/23 [phone]

## 2023-09-26 NOTE — Discharge Instructions (Addendum)
 You were seen this morning for dental pain. Please discontinue the ibuprofen  and begin taking the naprosyn . Follow up with dentistry for definitive management.

## 2023-09-27 ENCOUNTER — Ambulatory Visit (HOSPITAL_COMMUNITY)
Admission: EM | Admit: 2023-09-27 | Discharge: 2023-09-27 | Disposition: A | Discharge status: Home / Self Care | Attending: Family Medicine | Admitting: Family Medicine

## 2023-09-27 ENCOUNTER — Encounter (HOSPITAL_COMMUNITY): Payer: Self-pay | Admitting: *Deleted

## 2023-09-27 DIAGNOSIS — K0889 Other specified disorders of teeth and supporting structures: Secondary | ICD-10-CM

## 2023-09-27 MED ORDER — AMOXICILLIN-POT CLAVULANATE 875-125 MG PO TABS: 1.0000 | ORAL_TABLET | Freq: Two times a day (BID) | ORAL | 0 refills | Status: AC

## 2023-09-27 NOTE — ED Triage Notes (Signed)
 C/O various areas of dental pain over past 2-3 months; states received abx and pain med at [facility] 07/10/23 and felt some relief, but then dental pain returned. States went to dentist a few times over past month. Went to ED 09/25/23 but was given Naproxen  only, and now pt c/o swelling/lump to left lower gums along with left facial pain. Has been taking Naproxen , IBU, Tyl.

## 2023-09-27 NOTE — ED Provider Notes (Signed)
 [facility]    CSN: [id number] Arrival date & time: 09/27/23  0932      History   Chief Complaint Chief Complaint  Patient presents with   Dental Pain   Facial Pain    HPI Hunter Thompson is a 23 y.o. male past medical history of asthma, bipolar 1 reports for dental pain.  Patient reports he has had several months of intermittent left lower dental pain.  He was seen in June at urgent care and was prescribed Augmentin , ibuprofen , lidocaine  gargle which he states gave him relief.  He did see a dentist in between then but they were unable to numb him and did refer him to a specialist but due to cost he was unable to go.  He went to the ER 2 nights ago for the same dental pain where no infection was noted and he was prescribed naproxen .  He states his pain persists and he is trying to save up money to go see the specialist.  He has been taking the naproxen , ibuprofen , Tylenol  for symptoms.  No other concerns at this time.   Dental Pain   Past Medical History:  Diagnosis Date   Asthma    Bipolar 1 disorder [facility])    Depression     Patient Active Problem List   Diagnosis Date Noted   Pneumonia 03/10/2018    History reviewed. No pertinent surgical history.     Home Medications    Prior to Admission medications   Medication Sig Start Date End Date Taking? Authorizing Provider  acetaminophen  (TYLENOL ) 500 MG tablet Take 1 tablet (500 mg total) by mouth every 6 (six) hours as needed. 09/15/23  Yes Garrison, [place]  N, FNP  amoxicillin -clavulanate (AUGMENTIN ) 875-125 MG tablet Take 1 tablet by mouth every 12 (twelve) hours. 09/27/23  Yes Keeon Zurn, Jodi R, NP  naproxen  (NAPROSYN ) 500 MG tablet Take 1 tablet (500 mg total) by mouth 2 (two) times daily. 09/26/23  Yes Logan Ubaldo NOVAK, PA-C    Family History Family History  Problem Relation Age of Onset   Asthma Mother    Diabetes Father     Social History Social History   Tobacco Use   Smoking status: Some Days     Current packs/day: 0.50    Types: Cigarettes   Smokeless tobacco: Never  Vaping Use   Vaping status: Never Used  Substance Use Topics   Alcohol use: Yes    Comment: occasionally   Drug use: Not Currently    Types: Marijuana    Comment: occasionally     Allergies   Patient has no known allergies.   Review of Systems Review of Systems  HENT:  Positive for dental problem.      Physical Exam Triage Vital Signs ED Triage Vitals  Encounter Vitals Group     BP 09/27/23 1011 132/78     Girls Systolic BP Percentile --      Girls Diastolic BP Percentile --      Boys Systolic BP Percentile --      Boys Diastolic BP Percentile --      Pulse Rate 09/27/23 1011 62     Resp 09/27/23 1011 16     Temp 09/27/23 1011 98.1 F (36.7 C)     Temp Source 09/27/23 1011 Oral     SpO2 09/27/23 1011 100 %     Weight --      Height --      Head Circumference --  Peak Flow --      Pain Score 09/27/23 1012 7     Pain Loc --      Pain Education --      Exclude from Growth Chart --    No data found.  Updated Vital Signs BP 132/78   Pulse 62   Temp 98.1 F (36.7 C) (Oral)   Resp 16   SpO2 100%   Visual Acuity Right Eye Distance:   Left Eye Distance:   Bilateral Distance:    Right Eye Near:   Left Eye Near:    Bilateral Near:     Physical Exam Vitals and nursing note reviewed.  Constitutional:      General: He is not in acute distress.    Appearance: Normal appearance. He is not ill-appearing.  HENT:     Head: Normocephalic and atraumatic.     Mouth/Throat:      Comments: Tooth #19 is missing.  There is very minimal swelling of the gumline underneath tooth 18 and 20.  The gumline is tender with palpation.  There is no palpable abscess.  No facial swelling. Eyes:     Pupils: Pupils are equal, round, and reactive to light.  Cardiovascular:     Rate and Rhythm: Normal rate.  Pulmonary:     Effort: Pulmonary effort is normal.  Skin:    General: Skin is warm and dry.   Neurological:     General: No focal deficit present.     Mental Status: He is alert and oriented to person, place, and time.  Psychiatric:        Mood and Affect: Mood normal.        Behavior: Behavior normal.      UC Treatments / Results  Labs (all labs ordered are listed, but only abnormal results are displayed) Labs Reviewed - No data to display  EKG   Radiology No results found.  Procedures Procedures (including critical care time)  Medications Ordered in UC Medications - No data to display  Initial Impression / Assessment and Plan / UC Course  I have reviewed the triage vital signs and the nursing notes.  Pertinent labs & imaging results that were available during my care of the patient were reviewed by me and considered in my medical decision making (see chart for details).     Reviewed exam and symptoms with patient.  Will restart Augmentin  and he can continue the naproxen  and Tylenol  as needed.  Dental resources were provided for him to establish with a different dentist for second opinion and treatment.  He was instructed to go to the ER for any worsening symptoms and he verbalized understanding. Final Clinical Impressions(s) / UC Diagnoses   Final diagnoses:  Pain, dental     Discharge Instructions      Augmentin  twice daily for 7 days.  Continue naproxen  and over-the-counter Tylenol  as needed.  A list of dental resources has been provided for you to contact for second opinion regarding your dental pain.  Please go to the ER for any worsening symptoms.  I hope you feel better soon!    ED Prescriptions     Medication Sig Dispense Auth. Provider   amoxicillin -clavulanate (AUGMENTIN ) 875-125 MG tablet Take 1 tablet by mouth every 12 (twelve) hours. 14 tablet Gasper Hopes, Jodi R, NP      PDMP not reviewed this encounter.   Loreda Myla SAUNDERS, NP 09/27/23 1053

## 2023-09-27 NOTE — Discharge Instructions (Addendum)
 Augmentin  twice daily for 7 days.  Continue naproxen  and over-the-counter Tylenol  as needed.  A list of dental resources has been provided for you to contact for second opinion regarding your dental pain.  Please go to the ER for any worsening symptoms.  I hope you feel better soon!
